# Patient Record
Sex: Female | Born: 1945 | Race: Black or African American | Hispanic: No | Marital: Single
Health system: Midwestern US, Community
[De-identification: ages and names within clinical notes are randomized; demographics above are authoritative.]

## PROBLEM LIST (undated history)

## (undated) DIAGNOSIS — I2510 Atherosclerotic heart disease of native coronary artery without angina pectoris: Secondary | ICD-10-CM

## (undated) DIAGNOSIS — J45909 Unspecified asthma, uncomplicated: Secondary | ICD-10-CM

## (undated) DIAGNOSIS — I10 Essential (primary) hypertension: Secondary | ICD-10-CM

## (undated) DIAGNOSIS — C801 Malignant (primary) neoplasm, unspecified: Secondary | ICD-10-CM

## (undated) DIAGNOSIS — R202 Paresthesia of skin: Secondary | ICD-10-CM

## (undated) DIAGNOSIS — I509 Heart failure, unspecified: Secondary | ICD-10-CM

## (undated) DIAGNOSIS — E119 Type 2 diabetes mellitus without complications: Secondary | ICD-10-CM

## (undated) DIAGNOSIS — I519 Heart disease, unspecified: Secondary | ICD-10-CM

## (undated) DIAGNOSIS — E079 Disorder of thyroid, unspecified: Secondary | ICD-10-CM

## (undated) DIAGNOSIS — R011 Cardiac murmur, unspecified: Secondary | ICD-10-CM

## (undated) DIAGNOSIS — N200 Calculus of kidney: Secondary | ICD-10-CM

## (undated) DIAGNOSIS — E785 Hyperlipidemia, unspecified: Secondary | ICD-10-CM

## (undated) DIAGNOSIS — H4311 Vitreous hemorrhage, right eye: Principal | ICD-10-CM

## (undated) HISTORY — PX: ECTOPIC PREGNANCY SURGERY: SHX613

## (undated) HISTORY — PX: BREAST SURGERY: SHX581

## (undated) HISTORY — PX: KNEE SURGERY: SHX244

## (undated) HISTORY — DX: Hyperlipidemia, unspecified: E78.5

## (undated) HISTORY — DX: Calculus of kidney: N20.0

## (undated) HISTORY — DX: Atherosclerotic heart disease of native coronary artery without angina pectoris: I25.10

## (undated) HISTORY — DX: Unspecified asthma, uncomplicated: J45.909

## (undated) HISTORY — DX: Cardiac murmur, unspecified: R01.1

## (undated) HISTORY — DX: Heart disease, unspecified: I51.9

## (undated) HISTORY — PX: THYROID SURGERY: SHX805

## (undated) HISTORY — DX: Paresthesia of skin: R20.2

## (undated) HISTORY — PX: OTHER SURGICAL HISTORY: SHX169

---

## 2008-03-29 LAB — HM COLONOSCOPY

## 2008-06-13 LAB — HM MAMMOGRAPHY: HM Mammogram: NORMAL

## 2010-03-12 LAB — HM COLONOSCOPY: HM COLON: NORMAL

## 2011-07-24 DIAGNOSIS — M259 Joint disorder, unspecified: Secondary | ICD-10-CM | POA: Diagnosis not present

## 2011-07-24 DIAGNOSIS — M1710 Unilateral primary osteoarthritis, unspecified knee: Secondary | ICD-10-CM | POA: Diagnosis not present

## 2011-07-24 DIAGNOSIS — M25569 Pain in unspecified knee: Secondary | ICD-10-CM | POA: Diagnosis not present

## 2011-09-14 DIAGNOSIS — I10 Essential (primary) hypertension: Secondary | ICD-10-CM | POA: Diagnosis not present

## 2011-09-14 DIAGNOSIS — M238X9 Other internal derangements of unspecified knee: Secondary | ICD-10-CM | POA: Diagnosis not present

## 2011-09-14 DIAGNOSIS — Z96659 Presence of unspecified artificial knee joint: Secondary | ICD-10-CM | POA: Diagnosis not present

## 2011-09-14 DIAGNOSIS — I2510 Atherosclerotic heart disease of native coronary artery without angina pectoris: Secondary | ICD-10-CM | POA: Diagnosis not present

## 2011-09-14 DIAGNOSIS — E1065 Type 1 diabetes mellitus with hyperglycemia: Secondary | ICD-10-CM | POA: Diagnosis not present

## 2011-09-14 DIAGNOSIS — E038 Other specified hypothyroidism: Secondary | ICD-10-CM | POA: Diagnosis not present

## 2013-06-14 LAB — TSH: TSH: 0.61 u[IU]/mL (ref <=5.90)

## 2013-06-14 LAB — BASIC METABOLIC PANEL WITH GFR
BUN: 10 mg/dL (ref 4–21)
Creatinine: 0.8 mg/dL (ref 0.5–1.1)
Glucose: 116 mg/dL

## 2013-12-05 ENCOUNTER — Ambulatory Visit (INDEPENDENT_AMBULATORY_CARE_PROVIDER_SITE_OTHER): Payer: MEDICARE | Admitting: Cardiology

## 2013-12-05 ENCOUNTER — Encounter: Payer: Self-pay | Admitting: Cardiology

## 2013-12-05 VITALS — BP 136/78 | HR 69 | Ht 61.0 in | Wt 184.0 lb

## 2013-12-05 DIAGNOSIS — K219 Gastro-esophageal reflux disease without esophagitis: Secondary | ICD-10-CM

## 2013-12-05 DIAGNOSIS — E038 Other specified hypothyroidism: Secondary | ICD-10-CM

## 2013-12-05 DIAGNOSIS — E039 Hypothyroidism, unspecified: Secondary | ICD-10-CM | POA: Insufficient documentation

## 2013-12-05 DIAGNOSIS — I259 Chronic ischemic heart disease, unspecified: Secondary | ICD-10-CM

## 2013-12-05 DIAGNOSIS — R739 Hyperglycemia, unspecified: Secondary | ICD-10-CM

## 2013-12-05 DIAGNOSIS — I10 Essential (primary) hypertension: Secondary | ICD-10-CM

## 2013-12-05 DIAGNOSIS — R011 Cardiac murmur, unspecified: Secondary | ICD-10-CM | POA: Insufficient documentation

## 2013-12-05 DIAGNOSIS — I5022 Chronic systolic (congestive) heart failure: Secondary | ICD-10-CM

## 2013-12-05 DIAGNOSIS — Z853 Personal history of malignant neoplasm of breast: Secondary | ICD-10-CM

## 2013-12-05 MED ORDER — METOPROLOL TARTRATE 50 MG PO TABS: ORAL_TABLET | ORAL | Status: DC

## 2013-12-05 MED ORDER — LEVOTHYROXINE SODIUM 50 MCG PO CAPS: ORAL_CAPSULE | ORAL | Status: DC

## 2013-12-05 MED ORDER — POTASSIUM CHLORIDE CRYS ER 10 MEQ PO TBCR: 10.0000 meq | EXTENDED_RELEASE_TABLET | Freq: Every day | ORAL | Status: DC

## 2013-12-05 MED ORDER — FUROSEMIDE 40 MG PO TABS
40.0000 mg | ORAL_TABLET | Freq: Every day | ORAL | Status: DC
Start: 2013-12-05 — End: 2014-05-29

## 2013-12-05 MED ORDER — LEVOTHYROXINE SODIUM 75 MCG PO TABS: ORAL_TABLET | ORAL | Status: DC

## 2013-12-05 NOTE — Patient Instructions (Addendum)
Will obtain labs today and call you with the results (LP/BMET/HFP/CBC/A1C//TSH/FT4)  Your physician recommends that you continue on your current medications as directed. Please refer to the Current Medication list given to you today.  Your physician recommends that you schedule a follow-up appointment in: [place] has requested that you have an echocardiogram. Echocardiography is a painless test that uses sound waves to create images of your heart. It provides your doctor with information about the size and shape of your heart and how well your heart's chambers and valves are working. This procedure takes approximately one hour. There are no restrictions for this procedure.  Your physician has requested that you have a lexiscan myoview. For further information please visit [email or web address]. Please follow instruction sheet, as given.  ARRANGE FOR YOU TO SEE DR Garret Reddish WITH [facility]   A chest x-ray takes a picture of the organs and structures inside the chest, including the heart, lungs, and blood vessels. This test can show several things, including, whether the heart is enlarges; whether fluid is building up in the lungs; and whether pacemaker / defibrillator leads are still in place. [place]

## 2013-12-05 NOTE — Progress Notes (Signed)
Alicia [name] Alert Date of Birth:  Aug 01, 1945 [facility] [address] [place], [place]  [address] [phone]        Fax   [phone]   History of Present Illness: This pleasant 68 year old [place] American woman is seen by me for the first time today.  She is self-referred.  She moved here about 3 months ago from [facility].  She is living with her daughter.  She comes to be established with cardiology.  She gives a history of having had congestive heart failure in 1990.  In 1993 she had a heart attack treated with a balloon and the stent.  We don't have any of those details. She has a history of borderline diabetes.  She has a history of high cholesterol and a history of elevated blood pressure.  She has never had a stroke. She has a history of previous breast cancer of the right breast and had a partial mastectomy followed by chemotherapy and radiation. She has a history of hypothyroidism and takes 50 mcg of Synthroid one daily and 75 mcg the next. She has been experiencing occasional chest discomfort.  Last week she took a nitroglycerin for chest discomfort with left arm aching. Her family history reveals that her father had diabetes and and had a history of heart trouble and died at age 38.  Her mother died of a massive heart attack at age 74. Current Outpatient Prescriptions  Medication Sig Dispense Refill  . anastrozole (ARIMIDEX) 1 MG tablet Take 1 mg by mouth daily.      . furosemide (LASIX) 40 MG tablet Take 40 mg by mouth.      . levothyroxine (SYNTHROID, LEVOTHROID) 75 MCG tablet Take 75 mcg by mouth daily before breakfast.      . Levothyroxine Sodium 50 MCG CAPS Take by mouth daily before breakfast.      . metoprolol (LOPRESSOR) 50 MG tablet Take 50 mg by mouth daily. Take 1/2 tablet in the morning and whole tablet in the evening.      . nitroGLYCERIN (NITROSTAT) 0.4 MG SL tablet Place 0.4 mg under the tongue every 5 (five) minutes as needed  for chest pain.      . potassium chloride (K-DUR,KLOR-CON) 10 MEQ tablet Take 10 mEq by mouth once.      . ranitidine (ZANTAC) 150 MG/10ML syrup Take 150 mg by mouth 2 (two) times daily.      . risperiDONE (RISPERDAL) 1 MG tablet Take 1 mg by mouth at bedtime.       No current facility-administered medications for this visit.    Allergies no known allergies  Patient Active Problem List   Diagnosis Date Noted  . Ischemic heart disease 12/05/2013  . Essential hypertension 12/05/2013  . Hypothyroidism 12/05/2013  . History of breast cancer in female 12/05/2013  . GERD (gastroesophageal reflux disease) 12/05/2013  . Chronic systolic congestive heart failure, NYHA class 4 12/05/2013  . Heart murmur 12/05/2013  . Hyperglycemia 12/05/2013    History  Smoking status  . Never Smoker   Smokeless tobacco  . Not on file    History  Alcohol Use No    No family history on file.  Review of Systems: Constitutional: no fever chills diaphoresis or fatigue or change in weight.  Head and neck: no hearing loss, no epistaxis, no photophobia or visual disturbance. Respiratory: No cough, shortness of breath or wheezing. Cardiovascular: No  peripheral edema, palpitations.  Positive for chest pain and left  arm discomfort Gastrointestinal: No abdominal distention, no abdominal pain, no change in bowel habits hematochezia or melena. Genitourinary: No dysuria, no frequency, no urgency, no nocturia. Musculoskeletal:No arthralgias, no back pain, no gait disturbance or myalgias. Neurological: No dizziness, no headaches, no numbness, no seizures, no syncope, no weakness, no tremors. Hematologic: No lymphadenopathy, no easy bruising. Psychiatric: No confusion, no hallucinations, no sleep disturbance.    Physical Exam: Filed Vitals:   12/05/13 1510  BP: 136/78  Pulse: 69  The patient appears to be in no distress.  Head and neck exam reveals that the pupils are equal and reactive.  The  extraocular movements are full.  There is no scleral icterus.  Mouth and pharynx are benign.  No lymphadenopathy.  No carotid bruits.  The jugular venous pressure is normal.  Thyroid is not enlarged or tender.  Chest is clear to percussion and auscultation.  No rales or rhonchi.  Expansion of the chest is symmetrical.  Heart reveals no abnormal lift or heave.  First and second heart sounds are normal.  There is no murmur gallop rub or click.  The abdomen is soft and nontender.  Bowel sounds are normoactive.  There is no hepatosplenomegaly or mass.  There are no abdominal bruits.  Extremities reveal no phlebitis or edema.  Pedal pulses are good.  There is no cyanosis or clubbing.  Neurologic exam is normal strength and no lateralizing weakness.  No sensory deficits.  Integument reveals no rash  EKG today shows normal sinus rhythm and poor R-wave progression anterior precordium and no ischemic changes at rest.  No old EKG for comparison  Assessment / Plan: 1.  History of ischemic heart disease status post acute 1993 treated in [place] with balloon and stent 2. history of congestive heart failure onset 1990.  Details not available 3. history of hyperglycemia and borderline diabetes 4. Hypercholesterolemia 5. Hypothyroidism 6. history of right breast cancer treated with partial mastectomy, chemotherapy and radiation.  Details not known.  Currently on Arimidex.  Disposition: We are checking a chest x-ray and getting baseline lab work on her today including CBC, A1c, lipid panel, hepatic function panel, basal metabolic panel, TSH, and free T4. She will return for a Lexa scan Myoview stress test.  She will also return for an echocardiogram. She will be obtaining a PCP.  We referred her to Dr. Garret Reddish.  Recheck here in 2 months for followup cardiology visit.

## 2013-12-06 LAB — HEPATIC FUNCTION PANEL
ALBUMIN: 3.7 g/dL (ref 3.5–5.2)
ALT: 19 U/L (ref 0–35)
AST: 23 U/L (ref 0–37)
Alkaline Phosphatase: 116 U/L (ref 39–117)
Bilirubin, Direct: 0.1 mg/dL (ref 0.0–0.3)
Total Bilirubin: 0.5 mg/dL (ref 0.2–1.2)
Total Protein: 8.2 g/dL (ref 6.0–8.3)

## 2013-12-06 LAB — BASIC METABOLIC PANEL
BUN: 9 mg/dL (ref 6–23)
CHLORIDE: 104 meq/L (ref 96–112)
CO2: 27 mEq/L (ref 19–32)
CREATININE: 1.1 mg/dL (ref 0.4–1.2)
Calcium: 9.2 mg/dL (ref 8.4–10.5)
GFR: 66.81 mL/min (ref >60.00)
Glucose, Bld: 96 mg/dL (ref 70–99)
POTASSIUM: 3.4 meq/L — AB (ref 3.5–5.1)
Sodium: 139 mEq/L (ref 135–145)

## 2013-12-06 LAB — CBC WITH DIFFERENTIAL/PLATELET
Basophils Absolute: 0 10*3/uL (ref 0.0–0.1)
Basophils Relative: 0.1 % (ref 0.0–3.0)
Eosinophils Absolute: 0.1 10*3/uL (ref 0.0–0.7)
Eosinophils Relative: 1.6 % (ref 0.0–5.0)
HEMATOCRIT: 43.2 % (ref 36.0–46.0)
Hemoglobin: 14.2 g/dL (ref 12.0–15.0)
Lymphocytes Relative: 29.3 % (ref 12.0–46.0)
Lymphs Abs: 2.3 10*3/uL (ref 0.7–4.0)
MCHC: 33 g/dL (ref 30.0–36.0)
MCV: 83.9 fl (ref 78.0–100.0)
MONO ABS: 0.3 10*3/uL (ref 0.1–1.0)
MONOS PCT: 4.2 % (ref 3.0–12.0)
NEUTROS PCT: 64.8 % (ref 43.0–77.0)
Neutro Abs: 5.1 10*3/uL (ref 1.4–7.7)
PLATELETS: 283 10*3/uL (ref 150.0–400.0)
RBC: 5.15 Mil/uL — AB (ref 3.87–5.11)
RDW: 15.2 % (ref 11.5–15.5)
WBC: 7.9 10*3/uL (ref 4.0–10.5)

## 2013-12-06 LAB — LIPID PANEL
CHOL/HDL RATIO: 5
CHOLESTEROL: 191 mg/dL (ref 0–200)
HDL: 37.8 mg/dL — AB (ref >39.00)
NonHDL: 153.2
TRIGLYCERIDES: 225 mg/dL — AB (ref 0.0–149.0)
VLDL: 45 mg/dL — AB (ref 0.0–40.0)

## 2013-12-06 LAB — HEMOGLOBIN A1C: HEMOGLOBIN A1C: 5.8 % (ref 4.6–6.5)

## 2013-12-06 LAB — LDL CHOLESTEROL, DIRECT: Direct LDL: 114.4 mg/dL

## 2013-12-06 LAB — T4, FREE: FREE T4: 0.92 ng/dL (ref 0.60–1.60)

## 2013-12-06 LAB — TSH: TSH: 8.95 u[IU]/mL — AB (ref 0.35–4.50)

## 2013-12-07 NOTE — Progress Notes (Signed)
Quick Note:  Please report to patient. The recent labs are stable. Continue same medication and careful diet. The potassium is low so I want her to increase potassium to two tablets daily. Her LDL cholesterol is too high so I want her to take a statin ---atorvastatin 20 mg daily. Recheck Lipid panel, BMET and HFP when she returns for 2 month OV. Her thyroid is satisfactory at current regimen. A1C is okay. ______

## 2013-12-09 ENCOUNTER — Telehealth: Payer: Self-pay | Admitting: *Deleted

## 2013-12-09 DIAGNOSIS — E7800 Pure hypercholesterolemia, unspecified: Secondary | ICD-10-CM

## 2013-12-09 MED ORDER — ATORVASTATIN CALCIUM 20 MG PO TABS: 20.0000 mg | ORAL_TABLET | Freq: Every day | ORAL | Status: DC

## 2013-12-09 NOTE — Telephone Encounter (Signed)
Advised daughter of labs and sent Rx to pharmacy

## 2013-12-09 NOTE — Telephone Encounter (Signed)
Message copied by Earvin Hansen on Fri Dec 09, 2013  1:50 PM ------      Message from: Darlin Coco      Created: Wed Dec 07, 2013 10:31 AM       Please report to patient.  The recent labs are stable. Continue same medication and careful diet. The potassium is low so I want her to increase potassium to two tablets daily. Her LDL cholesterol is too high so I want her to take a statin ---atorvastatin 20 mg daily. Recheck Lipid panel, BMET and HFP when she returns for 2 month OV.      Her thyroid is satisfactory at current regimen. A1C is okay. ------

## 2013-12-13 ENCOUNTER — Other Ambulatory Visit (HOSPITAL_COMMUNITY): Payer: MEDICARE

## 2013-12-13 ENCOUNTER — Encounter (HOSPITAL_COMMUNITY): Payer: MEDICARE

## 2013-12-16 ENCOUNTER — Ambulatory Visit (HOSPITAL_COMMUNITY): Payer: MEDICARE | Attending: Cardiology | Admitting: Radiology

## 2013-12-16 DIAGNOSIS — I10 Essential (primary) hypertension: Secondary | ICD-10-CM | POA: Diagnosis not present

## 2013-12-16 DIAGNOSIS — R011 Cardiac murmur, unspecified: Secondary | ICD-10-CM | POA: Insufficient documentation

## 2013-12-16 NOTE — Progress Notes (Signed)
Echocardiogram performed.  

## 2014-01-03 ENCOUNTER — Telehealth: Payer: Self-pay | Admitting: *Deleted

## 2014-01-03 NOTE — Telephone Encounter (Signed)
noted 

## 2014-01-03 NOTE — Telephone Encounter (Signed)
-----   Message from Philbert Riser sent at 12/13/2013 10:23 AM EST ----- Regarding: FW: Ok to R/S [place], This  patient was late for her echo this morning so I reschedule it for 12/16/13 and she also said that she did not want to have her Myoview it made her sick the last time.See below the Insurance approved it,Charmain is aware of what the patient said to me this morning. ----- Message -----    From: Belt: 12/13/2013   9:58 AM      To: Philbert Riser Subject: Ok to R/S                                      Deb  So of course when i sent info STAT to ins co they approved it overnight.  Ok to r/s [employer].  Thanks for your help!

## 2014-02-15 ENCOUNTER — Ambulatory Visit: Payer: MEDICARE | Admitting: Cardiology

## 2014-02-20 ENCOUNTER — Emergency Department (HOSPITAL_COMMUNITY)
Admission: EM | Admit: 2014-02-20 | Discharge: 2014-02-20 | Disposition: A | Discharge status: Home / Self Care | Payer: Medicare Other | Attending: Emergency Medicine | Admitting: Emergency Medicine

## 2014-02-20 ENCOUNTER — Encounter (HOSPITAL_COMMUNITY): Payer: Self-pay | Admitting: Emergency Medicine

## 2014-02-20 ENCOUNTER — Emergency Department (HOSPITAL_COMMUNITY): Payer: Medicare Other

## 2014-02-20 DIAGNOSIS — R079 Chest pain, unspecified: Secondary | ICD-10-CM

## 2014-02-20 DIAGNOSIS — E079 Disorder of thyroid, unspecified: Secondary | ICD-10-CM | POA: Insufficient documentation

## 2014-02-20 DIAGNOSIS — R0602 Shortness of breath: Secondary | ICD-10-CM | POA: Insufficient documentation

## 2014-02-20 DIAGNOSIS — Z8589 Personal history of malignant neoplasm of other organs and systems: Secondary | ICD-10-CM | POA: Diagnosis not present

## 2014-02-20 DIAGNOSIS — I509 Heart failure, unspecified: Secondary | ICD-10-CM | POA: Insufficient documentation

## 2014-02-20 DIAGNOSIS — Z79899 Other long term (current) drug therapy: Secondary | ICD-10-CM | POA: Insufficient documentation

## 2014-02-20 DIAGNOSIS — I10 Essential (primary) hypertension: Secondary | ICD-10-CM | POA: Insufficient documentation

## 2014-02-20 DIAGNOSIS — E119 Type 2 diabetes mellitus without complications: Secondary | ICD-10-CM | POA: Insufficient documentation

## 2014-02-20 HISTORY — DX: Disorder of thyroid, unspecified: E07.9

## 2014-02-20 HISTORY — DX: Essential (primary) hypertension: I10

## 2014-02-20 HISTORY — DX: Malignant (primary) neoplasm, unspecified: C80.1

## 2014-02-20 HISTORY — DX: Type 2 diabetes mellitus without complications: E11.9

## 2014-02-20 HISTORY — DX: Heart failure, unspecified: I50.9

## 2014-02-20 LAB — CBC
HEMATOCRIT: 43.7 % (ref 36.0–46.0)
Hemoglobin: 14.9 g/dL (ref 12.0–15.0)
MCH: 27.6 pg (ref 26.0–34.0)
MCHC: 34.1 g/dL (ref 30.0–36.0)
MCV: 81.1 fL (ref 78.0–100.0)
Platelets: 249 10*3/uL (ref 150–400)
RBC: 5.39 MIL/uL — ABNORMAL HIGH (ref 3.87–5.11)
RDW: 14.6 % (ref 11.5–15.5)
WBC: 7.3 10*3/uL (ref 4.0–10.5)

## 2014-02-20 LAB — BASIC METABOLIC PANEL
Anion gap: 9 (ref 5–15)
BUN: 11 mg/dL (ref 6–23)
CO2: 29 mmol/L (ref 19–32)
CREATININE: 0.99 mg/dL (ref 0.50–1.10)
Calcium: 9.4 mg/dL (ref 8.4–10.5)
Chloride: 103 mEq/L (ref 96–112)
GFR, EST AFRICAN AMERICAN: 66 mL/min — AB (ref >90)
GFR, EST NON AFRICAN AMERICAN: 57 mL/min — AB (ref >90)
GLUCOSE: 131 mg/dL — AB (ref 70–99)
POTASSIUM: 3.4 mmol/L — AB (ref 3.5–5.1)
SODIUM: 141 mmol/L (ref 135–145)

## 2014-02-20 LAB — D-DIMER, QUANTITATIVE (NOT AT ARMC): D-Dimer, Quant: 0.29 ug/mL-FEU (ref 0.00–0.48)

## 2014-02-20 LAB — I-STAT TROPONIN, ED: Troponin i, poc: 0 ng/mL (ref 0.00–0.08)

## 2014-02-20 LAB — BRAIN NATRIURETIC PEPTIDE: B NATRIURETIC PEPTIDE 5: 34.3 pg/mL (ref 0.0–100.0)

## 2014-02-20 MED ORDER — MORPHINE SULFATE 4 MG/ML IJ SOLN
4.0000 mg | Freq: Once | INTRAMUSCULAR | Status: AC
  Administered 2014-02-20: 4 mg via INTRAVENOUS
  Filled 2014-02-20: qty 1

## 2014-02-20 NOTE — ED Notes (Signed)
AVS explained in detail. Knows to follow up with cardiology. RR even/unlabored.

## 2014-02-20 NOTE — Discharge Instructions (Signed)
Call for a follow up appointment with a Family or Primary Care Provider.  Call your cardiologist for further evaluation of your chest pain. Return if Symptoms worsen.   Take medication as prescribed.

## 2014-02-20 NOTE — ED Notes (Signed)
Pt states that started haivng central chest pain last night with SHOB.  Pt states that she has had these similar symptoms before and her lungs were collapsed, so pt is requesting a chest xray.  Pt has CHF.

## 2014-02-20 NOTE — ED Provider Notes (Signed)
  Face-to-face evaluation   History: Patient presents for evaluation of shortness of breath with a feeling like her lung is collapsed.  She also reports a 10 pound weight gain, recently.  She denies chest pain, weakness or dizziness.  Physical exam: Alert, calm, cooperative.  She is not in respiratory distress.  Neuro exam is grossly nonfocal   Medical screening examination/treatment/procedure(s) were conducted as a shared visit with non-physician practitioner(s) and myself.  I personally evaluated the patient during the encounter  Richarda Blade, MD 02/24/14 [phone]

## 2014-02-20 NOTE — ED Provider Notes (Signed)
CSN: [id number]     Arrival date & time 02/20/14  61 History   First MD Initiated Contact with Patient 02/20/14 1109     Chief Complaint  Patient presents with  . Chest Pain  . Shortness of Breath     (Consider location/radiation/quality/duration/timing/severity/associated sxs/prior Treatment) HPI Comments: The patient is a 69 year old female past medical history of CAD status post stenting, pneumothorax status post lung tacking, breast cancer s/p lumpectomy, chest discomfort since last night. She reports central chest discomfort, nonradiating, described as dull, cramping worsened with exertion. Denies pleuras, Patient reports associated dyspnea. Denies lower extremity edema, palpitations, cough. Reports symptoms similar to previous pneumothorax. No treatment prior to arrival. Patient reports last stress test greater than 5 years ago.  PCP: unsure, scheduled first appointment to establish care.  Recently transferred from [place] Cardiologist: [place]   Patient is a 69 y.o. female presenting with chest pain and shortness of breath. The history is provided by the patient. No language interpreter was used.  Chest Pain Associated symptoms: shortness of breath   Associated symptoms: no abdominal pain, no cough, no fever and no palpitations   Shortness of Breath Associated symptoms: chest pain   Associated symptoms: no abdominal pain, no cough and no fever     Past Medical History  Diagnosis Date  . CHF (congestive heart failure)   . Thyroid disease   . Hypertension   . Diabetes mellitus without complication   . Cancer    Past Surgical History  Procedure Laterality Date  . Knee surgery    . Thyroid surgery    . Lung stapled to ribs    . Breast surgery    . Lymphnodes Right     axillary   No family history on file. History  Substance Use Topics  . Smoking status: Never Smoker   . Smokeless tobacco: Not on file  . Alcohol Use: No   OB History    No data available      Review of Systems  Constitutional: Negative for fever and chills.  Respiratory: Positive for shortness of breath. Negative for cough.   Cardiovascular: Positive for chest pain. Negative for palpitations and leg swelling.  Gastrointestinal: Negative for abdominal pain.      Allergies  Review of patient's allergies indicates no known allergies.  Home Medications   Prior to Admission medications   Medication Sig Start Date End Date Taking? Authorizing Provider  anastrozole (ARIMIDEX) 1 MG tablet Take 1 mg by mouth daily.    Historical Provider, MD  atorvastatin (LIPITOR) 20 MG tablet Take 1 tablet (20 mg total) by mouth daily. 12/09/13   Darlin Coco, MD  furosemide (LASIX) 40 MG tablet Take 1 tablet (40 mg total) by mouth daily. 12/05/13   Darlin Coco, MD  levothyroxine (SYNTHROID, LEVOTHROID) 75 MCG tablet Every other day alternating with 50 mcg 12/05/13   Darlin Coco, MD  Levothyroxine Sodium 50 MCG CAPS Every other day alternating with 75 mcg 12/05/13   Darlin Coco, MD  metoprolol (LOPRESSOR) 50 MG tablet Take 1/2 tablet in the morning and whole tablet in the evening. 12/05/13   Darlin Coco, MD  nitroGLYCERIN (NITROSTAT) 0.4 MG SL tablet Place 0.4 mg under the tongue every 5 (five) minutes as needed for chest pain.    Historical Provider, MD  potassium chloride (K-DUR,KLOR-CON) 10 MEQ tablet Take 1 tablet (10 mEq total) by mouth daily. 12/05/13   Darlin Coco, MD  ranitidine (ZANTAC) 150 MG/10ML syrup Take 150 mg by mouth 2 (  two) times daily.    Historical Provider, MD  risperiDONE (RISPERDAL) 1 MG tablet Take 1 mg by mouth at bedtime.    Historical Provider, MD   BP 156/75 mmHg  Pulse 65  Temp(Src) 98.5 F (36.9 C) (Oral)  Resp 20  SpO2 96% Physical Exam  Constitutional: She is oriented to person, place, and time. She appears well-developed and well-nourished. No distress.  HENT:  Head: Normocephalic and atraumatic.  Neck: Neck supple.   Cardiovascular: Normal rate and regular rhythm.   No lower extremity edema. No calf tenderness.  Pulmonary/Chest: Effort normal. No respiratory distress. She has no wheezes. She has no rales. She exhibits no tenderness.  Abdominal: Soft. There is no tenderness. There is no rebound and no guarding.  Neurological: She is alert and oriented to person, place, and time.  Skin: Skin is warm and dry. She is not diaphoretic.  Psychiatric: She has a normal mood and affect. Her behavior is normal.  Nursing note and vitals reviewed.   ED Course  Procedures (including critical care time) Labs Review Labs Reviewed  CBC - Abnormal; Notable for the following:    RBC 5.39 (*)    All other components within normal limits  BASIC METABOLIC PANEL - Abnormal; Notable for the following:    Potassium 3.4 (*)    Glucose, Bld 131 (*)    GFR calc non Af Amer 57 (*)    GFR calc Af Amer 66 (*)    All other components within normal limits  BRAIN NATRIURETIC PEPTIDE  D-DIMER, QUANTITATIVE  Randolm Idol, ED    Imaging Review Dg Chest 2 View  02/20/2014   CLINICAL DATA:  Nonsmoker. New onset mid chest pain. Discomfort shortness of breath. History of CHF.  EXAM: CHEST  2 VIEW  COMPARISON:  None.  FINDINGS: Heart size is normal. There is minimal pericardial or mediastinal calcification. No focal consolidations or pleural effusions. No pulmonary edema. Visualized osseous structures have a normal appearance.  IMPRESSION: 1.  No evidence for acute cardiopulmonary abnormality. 2. Suspect pericardial calcifications.   Electronically Signed   By: Shon Hale M.D.   On: 02/20/2014 11:43     EKG Interpretation   Date/Time:  Monday February 20 2014 10:46:39 EST Ventricular Rate:  66 PR Interval:  166 QRS Duration: 73 QT Interval:  443 QTC Calculation: 464 R Axis:   -4 Text Interpretation:  Sinus rhythm Probable left atrial enlargement Low  voltage, precordial leads Baseline wander in lead(s) II V2 No old  tracing  to compare Confirmed by [facility]  MD, ELLIOTT [phone]) on 02/20/2014 12:35:08  PM      MDM   Final diagnoses:  Chest pain, unspecified chest pain type   Patient with known CAD presents with atypical chest discomfort and sensation of dyspnea for greater than 12 hours. SPO2 100% on room air with talking. 12/2013 EF 50-55%. X-ray without concerning abnormalities EKG with likely left atrial enlargement no previous tracing to compare.  Able to ambulate without be coming hypoxic. Initial troponin negative. Patient with a positive history of breast cancer, d-dimer ordered. Re-eval pt resting comfortably in room, reports resolution of symptoms after medication. D-dimer negative, workup without concerning abnormalities. Discussed patient history, condition with Dr. Eulis Foster who also evaluated the patient. Plan to discharge with cardiology follow-up. Discussed lab results, imaging results, and treatment plan with the patient. Return precautions given. Reports understanding and no other concerns at this time.  Patient is stable for discharge at this time.  Harvie Heck, PA-C 02/21/14 0740  Richarda Blade, MD 02/24/14 [phone]

## 2014-03-14 ENCOUNTER — Ambulatory Visit: Payer: Self-pay | Admitting: Cardiology

## 2014-03-22 ENCOUNTER — Encounter: Payer: Self-pay | Admitting: Cardiology

## 2014-05-29 ENCOUNTER — Encounter: Payer: Self-pay | Admitting: Family Medicine

## 2014-05-29 ENCOUNTER — Ambulatory Visit (INDEPENDENT_AMBULATORY_CARE_PROVIDER_SITE_OTHER): Payer: Medicare Other | Admitting: Family Medicine

## 2014-05-29 VITALS — BP 112/80 | HR 81 | Temp 98.9°F | Wt 189.0 lb

## 2014-05-29 DIAGNOSIS — E7800 Pure hypercholesterolemia, unspecified: Secondary | ICD-10-CM

## 2014-05-29 DIAGNOSIS — I5022 Chronic systolic (congestive) heart failure: Secondary | ICD-10-CM

## 2014-05-29 DIAGNOSIS — Z853 Personal history of malignant neoplasm of breast: Secondary | ICD-10-CM

## 2014-05-29 DIAGNOSIS — I259 Chronic ischemic heart disease, unspecified: Secondary | ICD-10-CM

## 2014-05-29 DIAGNOSIS — I10 Essential (primary) hypertension: Secondary | ICD-10-CM

## 2014-05-29 DIAGNOSIS — E038 Other specified hypothyroidism: Secondary | ICD-10-CM | POA: Diagnosis not present

## 2014-05-29 DIAGNOSIS — F209 Schizophrenia, unspecified: Secondary | ICD-10-CM | POA: Diagnosis not present

## 2014-05-29 DIAGNOSIS — E785 Hyperlipidemia, unspecified: Secondary | ICD-10-CM | POA: Diagnosis not present

## 2014-05-29 MED ORDER — LEVOTHYROXINE SODIUM 50 MCG PO CAPS: ORAL_CAPSULE | ORAL | Status: DC

## 2014-05-29 MED ORDER — LEVOTHYROXINE SODIUM 75 MCG PO TABS: 75.0000 ug | ORAL_TABLET | ORAL | Status: DC

## 2014-05-29 MED ORDER — POTASSIUM CHLORIDE CRYS ER 10 MEQ PO TBCR: 10.0000 meq | EXTENDED_RELEASE_TABLET | Freq: Every day | ORAL | Status: DC

## 2014-05-29 MED ORDER — METOPROLOL TARTRATE 50 MG PO TABS: ORAL_TABLET | ORAL | Status: DC

## 2014-05-29 MED ORDER — ATORVASTATIN CALCIUM 20 MG PO TABS: 20.0000 mg | ORAL_TABLET | Freq: Every day | ORAL | Status: DC

## 2014-05-29 MED ORDER — FUROSEMIDE 40 MG PO TABS: 40.0000 mg | ORAL_TABLET | Freq: Every day | ORAL | Status: DC

## 2014-05-29 MED ORDER — RISPERIDONE 1 MG PO TABS: 1.0000 mg | ORAL_TABLET | Freq: Every day | ORAL | Status: DC

## 2014-05-29 MED ORDER — ANASTROZOLE 1 MG PO TABS: 1.0000 mg | ORAL_TABLET | Freq: Every evening | ORAL | Status: DC

## 2014-05-29 MED ORDER — RANITIDINE HCL 150 MG PO TABS: 150.0000 mg | ORAL_TABLET | Freq: Two times a day (BID) | ORAL | Status: DC

## 2014-05-29 NOTE — Assessment & Plan Note (Addendum)
Refilled arimedex but need records and have referred to oncology as typically i would not prescribe that without oncology weighing in. Mammogram handout given.

## 2014-05-29 NOTE — Assessment & Plan Note (Signed)
S: reported MI 31 with angioplasty. Noncompliant with statin as prescribed by cardiology. Occasional chest pain with difficult to quantify amount that is controlled by nitro with no recent increase.  A/P: Patient was supposed to follow up with cards and I have encouraged her to do so. Also encouraged her to take her statin.

## 2014-05-29 NOTE — Assessment & Plan Note (Addendum)
S: compliant with lasix. Per last echo this appears to be diastolic.  A/P: continue lasix, appears euvolemic. Follow up with cards. Per symptoms today, would seem more class II

## 2014-05-29 NOTE — Progress Notes (Signed)
Alicia Reddish, MD Phone: [phone]  Subjective:  Patient presents today to establish care. Has been a year without PCP, formerly lived in [place] complaint-noted.   See problem oriented charting and problem list overview Insurance wouldn't cover stress test so only had echo Colonoscopy 2014-need records Last pap 2014-no abnormal paps. 5 pregnancies, 3 live births. Does not need further paps.  ROS- pertinent negatives to problem oriented charting include: denies shortness of breath with rest or with walking, diaphoresis, nausea, ROS-No hair or nail changes. No heat/cold intolerance. No constipation or diarrhea. Denies shakiness or anxiety.  The following were reviewed and entered/updated in epic: Past Medical History  Diagnosis Date  . CHF (congestive heart failure)   . Thyroid disease   . Hypertension   . Diabetes mellitus without complication   . Cancer   . Heart disease   . Asthma     childhood  . Heart murmur     echo without obvious valvular issues  . Hyperlipidemia   . Kidney stone    Patient Active Problem List   Diagnosis Date Noted  . Schizophrenia 05/29/2014    Priority: High  . Ischemic heart disease 12/05/2013    Priority: High  . Chronic systolic congestive heart failure, NYHA class 4 12/05/2013    Priority: High  . Hyperlipidemia 05/29/2014    Priority: Medium  . Essential hypertension 12/05/2013    Priority: Medium  . Hypothyroidism 12/05/2013    Priority: Medium  . History of breast cancer in female 12/05/2013    Priority: Medium  . Hyperglycemia 12/05/2013    Priority: Medium  . GERD (gastroesophageal reflux disease) 12/05/2013    Priority: Low  . Heart murmur 12/05/2013   Past Surgical History  Procedure Laterality Date  . Knee surgery      knee replacement  . Thyroid surgery      surgery x2, adioactive iodine afte rreportedly 2006  . Lung stapled to ribs      pneumothorax x 2  . Breast surgery    . Lymphnodes Right    axillary  . Ectopic pregnancy surgery      Family History  Problem Relation Age of Onset  . Hyperlipidemia Mother   . Heart attack Father 39  . Heart attack Mother 63  . Stroke Father 62  . High blood pressure Father     Medications- reviewed and updated Current Outpatient Prescriptions  Medication Sig Dispense Refill  . anastrozole (ARIMIDEX) 1 MG tablet Take 1 tablet (1 mg total) by mouth every evening. 90 tablet 3  . atorvastatin (LIPITOR) 20 MG tablet Take 1 tablet (20 mg total) by mouth daily. 90 tablet 3  . calcium-vitamin D (OSCAL WITH D) 500-200 MG-UNIT per tablet Take 2 tablets by mouth every evening.    . furosemide (LASIX) 40 MG tablet Take 1 tablet (40 mg total) by mouth at bedtime. 30 tablet 3  . levothyroxine (SYNTHROID, LEVOTHROID) 75 MCG tablet Take 1 tablet (75 mcg total) by mouth every other day. Every other day alternating with 50 mcg 90 tablet 3  . Levothyroxine Sodium 50 MCG CAPS Every other day alternating with 75 mcg 30 capsule 3  . metoprolol (LOPRESSOR) 50 MG tablet Take 1/2 tablet in the morning and whole tablet in the evening. 45 tablet 3  . Multiple Vitamin (MULTIVITAMIN WITH MINERALS) TABS tablet Take 1 tablet by mouth daily.    . potassium chloride (K-DUR,KLOR-CON) 10 MEQ tablet Take 1 tablet (10 mEq total) by mouth daily. 90 tablet  3  . ranitidine (ZANTAC) 150 MG tablet Take 1 tablet (150 mg total) by mouth 2 (two) times daily. 90 tablet 3  . risperiDONE (RISPERDAL) 1 MG tablet Take 1 tablet (1 mg total) by mouth at bedtime. 90 tablet 3  . nitroGLYCERIN (NITROSTAT) 0.4 MG SL tablet Place 0.4 mg under the tongue every 5 (five) minutes as needed for chest pain.     Allergies-reviewed and updated No Known Allergies  History   Social History  . Marital Status: Single    Spouse Name: N/A  . Number of Children: N/A  . Years of Education: N/A   Social History Main Topics  . Smoking status: Never Smoker   . Smokeless tobacco: Not on file  . Alcohol  Use: No  . Drug Use: No  . Sexual Activity: Not on file   Other Topics Concern  . 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 Oropharynx normal. TM normal. Eyes: sclera and lids normal, PERRLA Neck: no thyromegaly, no cervical lymphadenopathy CV: RRR no murmurs rubs or gallops Lungs: CTAB no crackles, wheeze, rhonchi Abdomen: soft/nontender/nondistended/normal bowel sounds. No rebound or guarding.  Ext: no edema Skin: warm, dry, no rash  Neuro: 5/5 strength in upper and lower extremities, alert and oriented  Assessment/Plan:  Ischemic heart disease S: reported MI 1993 with angioplasty. Noncompliant with statin as prescribed by cardiology. Occasional chest pain with difficult to quantify amount that is controlled by nitro with no recent increase.  A/P: Patient was supposed to follow up with cards and I have encouraged her to do so. Also encouraged her to take her statin.     Chronic systolic congestive heart failure, NYHA class 4 S: compliant with lasix. Per last echo this appears to be diastolic.  A/P: continue lasix, appears euvolemic. Follow up with cards. Per symptoms today, would seem more class II    Schizophrenia S:Seeing shadows that are not there since running out of risperdal. Reports schizophrenia but no records available.  A/P: get records from prior providers. Discussed with daughter that needed help getting mother in with psychiatry given on antipsychotics.     Hypothyroidism S: last TSH  elevated. Denies symptoms A/P: check today, if TSH elevated, increase to 18mcg daily    History of breast cancer in female Refilled arimedex but need records and have referred to oncology as typically i would not prescribe that without oncology weighing in. Mammogram handout given.     Return precautions advised. 3 month follow up planned  Orders Placed This Encounter  Procedures  . CBC    [place]  . Comprehensive metabolic panel    [place]  . TSH    [place]  . Ambulatory referral to Oncology    Referral Priority:  Routine    Referral Type:  Consultation    Referral Reason:  Specialty Services Required    Number of Visits Requested:  1   Meds ordered this encounter  Medications  .  anastrozole (ARIMIDEX) 1 MG tablet    Sig: Take 1 tablet (1 mg total) by mouth every evening.    Dispense:  90 tablet    Refill:  3  . atorvastatin (LIPITOR) 20 MG tablet    Sig: Take 1 tablet (20 mg total) by mouth daily.    Dispense:  90 tablet    Refill:  3  . furosemide (LASIX) 40 MG tablet    Sig: Take 1 tablet (40 mg total) by mouth at bedtime.    Dispense:  30 tablet    Refill:  3  . levothyroxine (SYNTHROID, LEVOTHROID) 75 MCG tablet    Sig: Take 1 tablet (75 mcg total) by mouth every other day. Every other day alternating with 50 mcg    Dispense:  90 tablet    Refill:  3  . Levothyroxine Sodium 50 MCG CAPS    Sig: Every other day alternating with 75 mcg    Dispense:  30 capsule    Refill:  3  . metoprolol (LOPRESSOR) 50 MG tablet    Sig: Take 1/2 tablet in the morning and whole tablet in the evening.    Dispense:  45 tablet    Refill:  3  . potassium chloride (K-DUR,KLOR-CON) 10 MEQ tablet    Sig: Take 1 tablet (10 mEq total) by mouth daily.    Dispense:  90 tablet    Refill:  3  . ranitidine (ZANTAC) 150 MG tablet    Sig: Take 1 tablet (150 mg total) by mouth 2 (two) times daily.    Dispense:  90 tablet    Refill:  3  . risperiDONE (RISPERDAL) 1 MG tablet    Sig: Take 1  tablet (1 mg total) by mouth at bedtime.    Dispense:  90 tablet    Refill:  3

## 2014-05-29 NOTE — Assessment & Plan Note (Signed)
S: last TSH elevated. Denies symptoms A/P: check today, if TSH elevated, increase to 31mcg daily

## 2014-05-29 NOTE — Patient Instructions (Addendum)
Get mammogram scheduled.  We will call you within a week about your referral to oncology. If you do not hear within 2 weeks, give [place] a call.   Need you to check in with your insurance and get set up with psychiatry   Refilled all meds.   Check labs today  Sign release of information at the front desk for any discharge summaries, any colonoscopy, any immunizations, pap smears for last 10 years, office notes for last 3 years.   See each other in 3 months unless labs indicate we need to see each other sooner.   Happy to have your daughter as a patient if desired. Willing to work with patient on dates.

## 2014-05-29 NOTE — Assessment & Plan Note (Signed)
S:Seeing shadows that are not there since running out of risperdal. Reports schizophrenia but no records available.  A/P: get records from prior providers. Discussed with daughter that needed help getting mother in with psychiatry given on antipsychotics.

## 2014-05-30 LAB — TSH: TSH: 9.95 u[IU]/mL — ABNORMAL HIGH (ref 0.35–4.50)

## 2014-05-30 LAB — COMPREHENSIVE METABOLIC PANEL
ALBUMIN: 4.4 g/dL (ref 3.5–5.2)
ALK PHOS: 137 U/L — AB (ref 39–117)
ALT: 16 U/L (ref 0–35)
AST: 17 U/L (ref 0–37)
BUN: 8 mg/dL (ref 6–23)
CALCIUM: 9.6 mg/dL (ref 8.4–10.5)
CO2: 33 mEq/L — ABNORMAL HIGH (ref 19–32)
Chloride: 102 mEq/L (ref 96–112)
Creatinine, Ser: 0.96 mg/dL (ref 0.40–1.20)
GFR: 73.99 mL/min (ref >60.00)
Glucose, Bld: 111 mg/dL — ABNORMAL HIGH (ref 70–99)
POTASSIUM: 3.2 meq/L — AB (ref 3.5–5.1)
Sodium: 142 mEq/L (ref 135–145)
Total Bilirubin: 0.6 mg/dL (ref 0.2–1.2)
Total Protein: 8.1 g/dL (ref 6.0–8.3)

## 2014-05-30 LAB — CBC
HCT: 43.5 % (ref 36.0–46.0)
Hemoglobin: 14.5 g/dL (ref 12.0–15.0)
MCHC: 33.4 g/dL (ref 30.0–36.0)
MCV: 82 fl (ref 78.0–100.0)
Platelets: 315 10*3/uL (ref 150.0–400.0)
RBC: 5.3 Mil/uL — ABNORMAL HIGH (ref 3.87–5.11)
RDW: 15.7 % — AB (ref 11.5–15.5)
WBC: 7.7 10*3/uL (ref 4.0–10.5)

## 2014-05-30 MED ORDER — LEVOTHYROXINE SODIUM 75 MCG PO TABS: 75.0000 ug | ORAL_TABLET | Freq: Every day | ORAL | Status: DC

## 2014-05-30 NOTE — Addendum Note (Signed)
Addended by: Clyde Lundborg A on: 05/30/2014 01:40 PM   Modules accepted: Orders

## 2014-06-01 ENCOUNTER — Telehealth: Payer: Self-pay | Admitting: *Deleted

## 2014-06-01 NOTE — Telephone Encounter (Signed)
Received referral from Dr. Arrie Aran office.  Called and left a message for the pt to return my call so I can get her scheduled w/ a med onc.

## 2014-06-05 ENCOUNTER — Telehealth: Payer: Self-pay | Admitting: *Deleted

## 2014-06-05 NOTE — Telephone Encounter (Signed)
Left message for pt to return my call so I can schedule her w/ a med onc.

## 2014-06-08 ENCOUNTER — Telehealth: Payer: Self-pay | Admitting: *Deleted

## 2014-06-08 NOTE — Telephone Encounter (Signed)
Called pt and confirmed 07/03/14 med onc appt w/ her.  Obtained information from last facility seen and faxed a request for records.  Mailed calendar, welcoming packet & intake form to pt.

## 2014-06-15 LAB — BASIC METABOLIC PANEL
Potassium: 4.1 mmol/L (ref 3.4–5.3)
SODIUM: 146 mmol/L (ref 137–147)

## 2014-06-26 ENCOUNTER — Telehealth: Payer: Self-pay | Admitting: *Deleted

## 2014-06-26 NOTE — Telephone Encounter (Signed)
Called Alicia [name] in Medical Records at previous facility and all she has is for other diagnosis.  Called and left a message for the pt to return my call so I can verify diagnosis and past history facility.

## 2014-06-28 ENCOUNTER — Other Ambulatory Visit: Payer: Self-pay

## 2014-06-28 DIAGNOSIS — Z1231 Encounter for screening mammogram for malignant neoplasm of breast: Secondary | ICD-10-CM

## 2014-06-30 ENCOUNTER — Telehealth: Payer: Self-pay | Admitting: *Deleted

## 2014-06-30 NOTE — Telephone Encounter (Signed)
Left message for pt to call back.  Need to know when pt had mammogram, when, and where

## 2014-07-03 ENCOUNTER — Encounter: Payer: Self-pay | Admitting: Hematology

## 2014-07-03 ENCOUNTER — Ambulatory Visit: Payer: Medicare Other

## 2014-07-03 ENCOUNTER — Ambulatory Visit (HOSPITAL_BASED_OUTPATIENT_CLINIC_OR_DEPARTMENT_OTHER): Payer: Medicare Other | Admitting: Hematology

## 2014-07-03 VITALS — BP 133/66 | HR 66 | Temp 98.5°F | Resp 18 | Ht 61.0 in | Wt 185.3 lb

## 2014-07-03 DIAGNOSIS — Z85819 Personal history of malignant neoplasm of unspecified site of lip, oral cavity, and pharynx: Secondary | ICD-10-CM | POA: Insufficient documentation

## 2014-07-03 DIAGNOSIS — Z85818 Personal history of malignant neoplasm of other sites of lip, oral cavity, and pharynx: Secondary | ICD-10-CM | POA: Diagnosis not present

## 2014-07-03 DIAGNOSIS — Z853 Personal history of malignant neoplasm of breast: Secondary | ICD-10-CM | POA: Diagnosis not present

## 2014-07-03 NOTE — Progress Notes (Signed)
[facility]  Telephone:(336) [phone] Fax:([phone]) [place] Note   Patient Care Team: Alicia Olp, MD as PCP - General (Family Medicine) 07/03/2014  CHIEF COMPLAINTS/PURPOSE OF CONSULTATION:  History of breast and throat cancer  HISTORY OF PRESENTING ILLNESS:  Alicia [name] 69 y.o. female is here because of her personal history of breast and throat cancer. She was recently relocated from [facility] to [facility] to stay with her daughter. She was referred to establish her oncological care with [place]. She presents with her daughter.  According to patient, she was diagnosed with right breast cancer in 2003, had neoadjuvant chemo (TCx4), follow by lumpectomy, and radiation afterwards. She then started Anastrozole, and has been on it since February 2016. She denies any history of cancer recurrence. Unfortunately her prior oncology medical records was not available during her visit. She has last mammogram in 2014 in [place], she is schedule to have one on /11/2014.   She also had throat cancer about 10 years ago, she had surgeries twice, and radiation, no chemo. She has been disease-free since then.  She feels well overall, no pain, cough, dyspnea or GI symptoms. No weight loss. She has good appetite and energy level.    She has history of schizophrenia, has been out of medication Risperdal for a few months. Per patient's daughter, she does have some hallucination, but mood is stable.    MEDICAL HISTORY:  Past Medical History  Diagnosis Date  . CHF (congestive heart failure)   . Thyroid disease   . Hypertension   . Diabetes mellitus without complication   . Cancer   . Heart disease   . Asthma     childhood  . Heart murmur     echo without obvious valvular issues  . Hyperlipidemia   . Kidney stone     SURGICAL HISTORY: Past Surgical History  Procedure Laterality Date  . Knee surgery      knee replacement  .  Thyroid surgery      surgery x2, adioactive iodine afte rreportedly 2006  . Lung stapled to ribs      pneumothorax x 2  . Breast surgery    . Lymphnodes Right     axillary  . Ectopic pregnancy surgery      SOCIAL HISTORY: History   Social History  . Marital Status: Single    Spouse Name: N/A  . Number of Children: N/A  . Years of Education: N/A   Occupational History  . Not on file.   Social History Main Topics  . Smoking status: Never Smoker   . Smokeless tobacco: Not on file  . Alcohol Use: No  . Drug Use: No  . Sexual Activity: Not on file   Other Topics Concern  . 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 Hyperlipidemia Mother   . Heart attack Father 57  . Heart attack Mother 67  . Stroke Father 61  . High blood pressure Father     ALLERGIES:  has No Known Allergies.  MEDICATIONS:  Current Outpatient Prescriptions  Medication Sig Dispense Refill  . atorvastatin (LIPITOR) 20 MG tablet Take 1 tablet (20 mg total) by mouth daily. 90 tablet 3  . calcium-vitamin D (OSCAL WITH D) 500-200 MG-UNIT per tablet Take 2 tablets by mouth every evening.    . furosemide (LASIX) 40 MG tablet Take 1 tablet (40 mg total) by mouth at bedtime. 30 tablet 3  . levothyroxine (SYNTHROID, LEVOTHROID) 75 MCG tablet Take 1 tablet (75 mcg total) by mouth every other day. Every other day alternating with 50 mcg 90 tablet 3  . Levothyroxine Sodium 50 MCG CAPS Every other day alternating with 75 mcg 30 capsule 3  . metoprolol (LOPRESSOR) 50 MG tablet Take 1/2 tablet in the morning and whole tablet  in the evening. 45 tablet 3  . Multiple Vitamin (MULTIVITAMIN WITH MINERALS) TABS tablet Take 1 tablet by mouth daily.    . potassium chloride (K-DUR,KLOR-CON) 10 MEQ tablet Take 1 tablet (10 mEq total) by mouth daily. 90 tablet 3  . ranitidine (ZANTAC) 150 MG tablet Take 1 tablet (150 mg total) by mouth 2 (two) times daily. 90 tablet 3  . risperiDONE (RISPERDAL) 1 MG tablet Take 1 tablet (1 mg total) by mouth at bedtime. 90 tablet 3  . anastrozole (ARIMIDEX) 1 MG tablet Take 1 tablet (1 mg total) by mouth every evening. (Patient not taking: Reported on 07/03/2014) 90 tablet 3  . nitroGLYCERIN (NITROSTAT) 0.4 MG SL tablet Place 0.4 mg under the tongue every 5 (five) minutes as needed for chest pain.     No current facility-administered medications for this visit.    REVIEW OF SYSTEMS:   Constitutional: Denies fevers, chills or abnormal night sweats Eyes: Denies blurriness of vision, double vision or watery eyes Ears, nose, mouth, throat, and face: Denies mucositis or sore throat Respiratory: Denies cough, dyspnea or wheezes Cardiovascular: Denies palpitation, chest discomfort or lower extremity swelling Gastrointestinal:  Denies nausea, heartburn or change in bowel habits Skin: Denies abnormal skin rashes Lymphatics: Denies new lymphadenopathy or easy bruising Neurological:Denies numbness, tingling or new weaknesses Behavioral/Psych: Mood is stable, no new changes  All other systems were reviewed with the patient and are negative.  PHYSICAL EXAMINATION: ECOG PERFORMANCE STATUS: 0 - Asymptomatic  Filed Vitals:   07/03/14 1506  BP: 133/66  Pulse: 66  Temp: 98.5 F (36.9 C)  Resp: 18   Filed Weights   07/03/14 1506  Weight: 185 lb 4.8 oz (84.052 kg)    GENERAL:[name], no distress and comfortable SKIN: skin color, texture, turgor are normal, no rashes or significant lesions EYES: normal, conjunctiva are pink and non-injected, sclera clear OROPHARYNX:no exudate, no erythema and  lips, buccal mucosa, and tongue normal  NECK: supple, thyroid normal size, non-tender, without nodularity LYMPH:  no palpable lymphadenopathy in the cervical, axillary or inguinal LUNGS: clear to auscultation and percussion with normal breathing effort HEART: regular rate & rhythm and no murmurs and no lower extremity edema ABDOMEN:abdomen soft, non-tender and normal bowel sounds Musculoskeletal:no cyanosis of digits and no clubbing  PSYCH: [name] & oriented x 3 with fluent speech NEURO: no focal motor/sensory deficits Breasts: Breast inspection showed them to be symmetrical with no nipple discharge. Palpation of the breasts and axilla revealed no obvious mass that I could appreciate. The right breast and axillary surgical scar well healed.   LABORATORY DATA:  I have reviewed the data as listed Lab Results  Component Value Date   WBC 7.7 05/29/2014  HGB 14.5 05/29/2014   HCT 43.5 05/29/2014   MCV 82.0 05/29/2014   PLT 315.0 05/29/2014    Recent Labs  12/05/13 1643 02/20/14 1109 05/29/14 1645  NA 139 141 142  K 3.4* 3.4* 3.2*  CL 104 103 102  CO2 27 29 33*  GLUCOSE 96 131* 111*  BUN 9 11 8   CREATININE 1.1 0.99 0.96  CALCIUM 9.2 9.4 9.6  GFRNONAA  --  57*  --   GFRAA  --  66*  --   PROT 8.2  --  8.1  ALBUMIN 3.7  --  4.4  AST 23  --  17  ALT 19  --  16  ALKPHOS 116  --  137*  BILITOT 0.5  --  0.6  BILIDIR 0.1  --   --     RADIOGRAPHIC STUDIES: I have personally reviewed the radiological images as listed and agreed with the findings in the report. No results found.  ASSESSMENT & PLAN:  69 year old African-American female, with history of schizophrenia, coronary artery disease, hypertension, CHF, diabetes, hypothyroidism, and a personal history of breast cancer and throat cancer.   1. Right Breast cancer -Unfortunately I do not have her outside medical records during her initial visit. The patient she had node-positive right breast cancer, status post lumpectomy,  chemotherapy and radiation. It's been about 13 years since her initial diagnosis, I do not see a role of prolonged adjuvant antiestrogen therapy. So I'll not reveal her anastrozole. -We discussed at the surveillance plan. I recommend her to have mammogram once a year for which she is scheduled for next months, lab and exam with me once a year. -I encouraged her to have healthy diet, and the aggressive size. Also take calcium and vitamin D for bone health.  2. History of throat cancer -Her medical history is not again not available. We'll continue surveillance.  3. She will continue follow-up with her primary care physician for other medical issues. I encouraged her to get a psychiatrist and refill her Risperdal.  Follow-up -I'll see her back in one year with labs only -She will have mammograms next months I'll review her outside oncological records when I receive it. I'll call her afterwards if there are any change in her management plan.  All questions were answered. The patient knows to call the clinic with any problems, questions or concerns. I spent 40 minutes counseling the patient face to face. The total time spent in the appointment was 55 minutes and more than 50% was on counseling.     Truitt Merle, MD 07/03/2014 3:43 PM

## 2014-07-03 NOTE — Progress Notes (Signed)
Checked in new pt with no financial concerns.  Pt has 2 insurances so financial assistance may not be needed but she has my card for any billing questions or concerns.  ° °

## 2014-07-21 ENCOUNTER — Ambulatory Visit
Admission: RE | Admit: 2014-07-21 | Discharge: 2014-07-21 | Disposition: A | Discharge status: Home / Self Care | Payer: Medicare Other | Source: Ambulatory Visit

## 2014-07-21 DIAGNOSIS — Z1231 Encounter for screening mammogram for malignant neoplasm of breast: Secondary | ICD-10-CM

## 2014-07-25 ENCOUNTER — Encounter: Payer: Self-pay | Admitting: Family Medicine

## 2014-07-25 DIAGNOSIS — G4700 Insomnia, unspecified: Secondary | ICD-10-CM | POA: Insufficient documentation

## 2014-07-25 LAB — VITAMIN B12
Folic Acid: 8.3
VITAMIN B12 BIND CAP: 465

## 2014-07-26 ENCOUNTER — Encounter: Payer: Self-pay | Admitting: Family Medicine

## 2014-07-26 DIAGNOSIS — Z96659 Presence of unspecified artificial knee joint: Secondary | ICD-10-CM | POA: Insufficient documentation

## 2014-07-26 DIAGNOSIS — Z86010 Personal history of colonic polyps: Secondary | ICD-10-CM | POA: Insufficient documentation

## 2014-07-27 ENCOUNTER — Encounter: Payer: Self-pay | Admitting: Family Medicine

## 2014-08-31 ENCOUNTER — Ambulatory Visit (INDEPENDENT_AMBULATORY_CARE_PROVIDER_SITE_OTHER): Payer: Medicare Other | Admitting: Family Medicine

## 2014-08-31 ENCOUNTER — Encounter: Payer: Self-pay | Admitting: Family Medicine

## 2014-08-31 VITALS — BP 150/90 | HR 79 | Temp 98.6°F | Wt 180.0 lb

## 2014-08-31 DIAGNOSIS — I5022 Chronic systolic (congestive) heart failure: Secondary | ICD-10-CM | POA: Diagnosis not present

## 2014-08-31 DIAGNOSIS — E038 Other specified hypothyroidism: Secondary | ICD-10-CM | POA: Diagnosis not present

## 2014-08-31 DIAGNOSIS — E785 Hyperlipidemia, unspecified: Secondary | ICD-10-CM

## 2014-08-31 DIAGNOSIS — F209 Schizophrenia, unspecified: Secondary | ICD-10-CM

## 2014-08-31 DIAGNOSIS — I10 Essential (primary) hypertension: Secondary | ICD-10-CM

## 2014-08-31 MED ORDER — RISPERIDONE 1 MG PO TABS: 1.0000 mg | ORAL_TABLET | Freq: Every day | ORAL | Status: DC

## 2014-08-31 NOTE — Assessment & Plan Note (Signed)
S: previously controlled, minor poor control today BP Readings from Last 3 Encounters:  08/31/14 150/90  07/03/14 133/66  05/29/14 112/80  A/P: given last 2 readings controlled, we opted for home monitoring and follow up if elevated >140/90 before planned 3 month follow up.

## 2014-08-31 NOTE — Assessment & Plan Note (Addendum)
S: suspect poor control on labs though physically asymptomatic. plan had been to increase to levothyroxine 75 mcg daily. Patient reports not taking it as such, still alternating 50 and 75.  A/P: check TSH again, suspect will still be elevated and need to increase to levothyroxine 86mcg.

## 2014-08-31 NOTE — Patient Instructions (Addendum)
Update labs today  Please call to schedule cardiology follow up  I gave you 6 months worth of risperidal. You agreed to find a psychiatrist in that time.   Blood pressure up a bit. Advise you to get a home cuff and check at least twice a week. If regularly above 140/90 then come see me sooner.   See me back in 3 months or sooner as needed

## 2014-08-31 NOTE — Progress Notes (Signed)
Garret Reddish, MD  Subjective:  Alicia [name] is a 69 y.o. year old very pleasant female patient who presents with:  See problem oriented charting ROS- chest pain 1-2 x a month resolves with nitroglycerin, mild shortness of breath with this, DOE if does more than short term walking. Denies blurry vision or headaches  Past Medical History- CHF, CAD, schizophrenia, history breast cancer, HTN, hyperglycemia, HLD, hypothyroidism, GERD  Medications- reviewed and updated Current Outpatient Prescriptions  Medication Sig Dispense Refill  . atorvastatin (LIPITOR) 20 MG tablet Take 1 tablet (20 mg total) by mouth daily. 90 tablet 3  . calcium-vitamin D (OSCAL WITH D) 500-200 MG-UNIT per tablet Take 2 tablets by mouth every evening.    . furosemide (LASIX) 40 MG tablet Take 1 tablet (40 mg total) by mouth at bedtime. 30 tablet 3  . levothyroxine (SYNTHROID, LEVOTHROID) 75 MCG tablet Take 1 tablet (75 mcg total) by mouth every other day. Every other day alternating with 50 mcg 90 tablet 3  . Levothyroxine Sodium 50 MCG CAPS Every other day alternating with 75 mcg 30 capsule 3  . metoprolol (LOPRESSOR) 50 MG tablet Take 1/2 tablet in the morning and whole tablet in the evening. 45 tablet 3  . Multiple Vitamin (MULTIVITAMIN WITH MINERALS) TABS tablet Take 1 tablet by mouth daily.    . potassium chloride (K-DUR,KLOR-CON) 10 MEQ tablet Take 1 tablet (10 mEq total) by mouth daily. 90 tablet 3  . anastrozole (ARIMIDEX) 1 MG tablet Take 1 tablet (1 mg total) by mouth every evening. (Patient not taking: Reported on 07/03/2014) 90 tablet 3  . nitroGLYCERIN (NITROSTAT) 0.4 MG SL tablet Place 0.4 mg under the tongue every 5 (five) minutes as needed for chest pain.    . ranitidine (ZANTAC) 150 MG tablet Take 1 tablet (150 mg total) by mouth 2 (two) times daily. (Patient not taking: Reported on 08/31/2014) 90 tablet 3  . risperiDONE (RISPERDAL) 1 MG tablet Take 1 tablet (1 mg total) by mouth at bedtime. (Patient not  taking: Reported on 08/31/2014) 90 tablet 3   Objective: BP 150/90 mmHg  Pulse 79  Temp(Src) 98.6 F (37 C)  Wt 180 lb (81.647 kg) Gen: NAD, resting comfortably in chair Daughter present with patient CV: RRR no  rubs or gallops Lungs: CTAB no crackles, wheeze, rhonchi Abdomen: soft/nontender/nondistended/normal bowel sounds. Obese Ext: no edema Skin: warm, dry, no rash Neuro: grossly normal, moves all extremities   Assessment/Plan:  Schizophrenia S:Records did not mention schizophrenia but had been on risperdal. off risperidal for a year now, has not had any hallucinations. Not seeing shadows or things that are not there. Sits up all night watching tv then sleeps during the day. Slept much better on risperidal.  A/P: Had written rx last visit unclear why not filled. Refilled Risperdal for 6 months with agreement that patient would find psychiatry in that time frame.    Essential hypertension S: previously controlled, minor poor control today BP Readings from Last 3 Encounters:  08/31/14 150/90  07/03/14 133/66  05/29/14 112/80  A/P: given last 2 readings controlled, we opted for home monitoring and follow up if elevated >140/90 before planned 3 month follow up.    Hypothyroidism S: suspect poor control on labs though physically asymptomatic. plan had been to increase to levothyroxine 75 mcg daily. Patient reports not taking it as such, still alternating 50 and 75.  A/P: check TSH again, suspect will still be elevated and need to increase to levothyroxine 27mcg.  Chronic systolic congestive heart failure, NYHA class 4 S: appears controlled and euvolemic. Compliant with lasix.  A/P: continue lasix. Class III as struggling with walking more than short distances. Strongly advised cards follow up once again. There is some question as to why she never had myoview performed 11/2013 as planned. We will also check potassium as slightly hypokalemic last visit, may need to increase  daily use from 48meq potassium   Hyperlipidemia S: states compliant with atorvastatin, hopeful well controlled A/P: check direct LDL only as not fasting.    3 month f/u.   Orders Placed This Encounter  Procedures  . Basic metabolic panel    [place]  . TSH    [place]  . LDL cholesterol, direct    [place]    No orders of the defined types were placed in this encounter.

## 2014-08-31 NOTE — Assessment & Plan Note (Signed)
S: states compliant with atorvastatin, hopeful well controlled A/P: check direct LDL only as not fasting.

## 2014-08-31 NOTE — Assessment & Plan Note (Addendum)
S: appears controlled and euvolemic. Compliant with lasix.  A/P: continue lasix. Class III as struggling with walking more than short distances. Strongly advised cards follow up once again. There is some question as to why she never had myoview performed 11/2013 as planned. We will also check potassium as slightly hypokalemic last visit, may need to increase daily use from 33meq potassium

## 2014-08-31 NOTE — Assessment & Plan Note (Signed)
S:Records did not mention schizophrenia but had been on risperdal. off risperidal for a year now, has not had any hallucinations. Not seeing shadows or things that are not there. Sits up all night watching tv then sleeps during the day. Slept much better on risperidal.  A/P: Had written rx last visit unclear why not filled. Refilled Risperdal for 6 months with agreement that patient would find psychiatry in that time frame.

## 2014-09-01 LAB — BASIC METABOLIC PANEL
BUN: 9 mg/dL (ref 6–23)
CO2: 25 mEq/L (ref 19–32)
CREATININE: 0.92 mg/dL (ref 0.40–1.20)
Calcium: 9.4 mg/dL (ref 8.4–10.5)
Chloride: 104 mEq/L (ref 96–112)
GFR: 77.66 mL/min (ref >60.00)
GLUCOSE: 99 mg/dL (ref 70–99)
Potassium: 4.3 mEq/L (ref 3.5–5.1)
Sodium: 141 mEq/L (ref 135–145)

## 2014-09-01 LAB — LDL CHOLESTEROL, DIRECT: LDL DIRECT: 56 mg/dL

## 2014-09-01 LAB — TSH: TSH: 4.35 u[IU]/mL (ref 0.35–4.50)

## 2014-09-02 ENCOUNTER — Other Ambulatory Visit: Payer: Self-pay | Admitting: Family Medicine

## 2014-09-11 ENCOUNTER — Telehealth: Payer: Self-pay | Admitting: Cardiology

## 2014-09-11 MED ORDER — ISOSORBIDE MONONITRATE ER 30 MG PO TB24: 30.0000 mg | ORAL_TABLET | Freq: Every day | ORAL | Status: DC

## 2014-09-11 NOTE — Telephone Encounter (Signed)
Reviewed with Truitt Merle, NP who gave instructions for pt to start Imdur 30 mg by mouth daily and resume lopressor.  Also send message to Dr. Sherryl Barters nurse to see if sooner appt with Dr. Mare Ferrari available. Pt should go to ED if symptoms worsen. I spoke with pt and gave her these instructions.  Will send prescription for Imdur to [facility] on [address].  Pt asked about side effects and I told her headache is a possibility and she could take tylenol if needed. I made pt aware to follow label instructions for tylenol.

## 2014-09-11 NOTE — Telephone Encounter (Signed)
Spoke with Alicia [name]. She reports chest pain at 2 AM.  Relieved with 2 NTG.  No pain at present time and is feeling OK now.  First episode of chest pain was on 7/21 and was relieved with 2 NTG.  Saw Dr. Yong Channel and blood pressure was elevated at that time.  Alicia [name] reports being under a lot of stress due to family situations. Also reports chest pain on 7/28 that was relieved with 2 NTG.  Misses 2 doses of lasix last week due to traveling.  Out of Lopressor for last 3 days but is going to pick up at pharmacy and resume today.  Will review with provider in office.

## 2014-09-11 NOTE — Telephone Encounter (Signed)
New Message   Pt c/o of Chest Pain: STAT if CP now or developed within 24 hours  1. Are you having CP right now? NO  2. Are you experiencing any other symptoms (ex. SOB, nausea, vomiting, sweating)? NO  3. How long have you been experiencing CP?  08-31-14  4. Is your CP continuous or coming and going? It comes and goes  5. Have you taken Nitroglycerin? yes ?

## 2014-09-11 NOTE — Telephone Encounter (Signed)
Agree with plan 

## 2014-09-15 ENCOUNTER — Other Ambulatory Visit: Payer: Self-pay | Admitting: Cardiology

## 2014-09-18 ENCOUNTER — Telehealth: Payer: Self-pay | Admitting: Family Medicine

## 2014-09-18 MED ORDER — LEVOTHYROXINE SODIUM 50 MCG PO TABS: ORAL_TABLET | ORAL | Status: DC

## 2014-09-18 NOTE — Telephone Encounter (Signed)
Pt request refill of the following: levothyroxine (SYNTHROID, LEVOTHROID) 50 MCG tablet   Phamacy: [place]

## 2014-09-18 NOTE — Telephone Encounter (Signed)
Medication refilled

## 2014-11-15 ENCOUNTER — Encounter: Payer: Self-pay | Admitting: Cardiology

## 2014-11-15 ENCOUNTER — Ambulatory Visit (INDEPENDENT_AMBULATORY_CARE_PROVIDER_SITE_OTHER): Payer: Medicare Other | Admitting: Cardiology

## 2014-11-15 VITALS — BP 130/78 | HR 82 | Ht 61.0 in | Wt 188.1 lb

## 2014-11-15 DIAGNOSIS — R011 Cardiac murmur, unspecified: Secondary | ICD-10-CM

## 2014-11-15 DIAGNOSIS — I259 Chronic ischemic heart disease, unspecified: Secondary | ICD-10-CM | POA: Diagnosis not present

## 2014-11-15 DIAGNOSIS — Z853 Personal history of malignant neoplasm of breast: Secondary | ICD-10-CM

## 2014-11-15 DIAGNOSIS — I10 Essential (primary) hypertension: Secondary | ICD-10-CM | POA: Diagnosis not present

## 2014-11-15 NOTE — Progress Notes (Signed)
Cardiology Office Note   Date:  11/15/2014   ID:  Alicia [name] [birth year], MRN [id number]  PCP:  Garret Reddish, MD  Cardiologist: Darlin Coco MD  No chief complaint on file.     History of Present Illness: Alicia [name] is a 69 y.o. female who presents for a one-year follow-up office visit This pleasant 69 year old African American woman is seen for a one-year follow-up visit.. . She is living with her daughter. She comes to be established with cardiology. She gives a history of having had congestive heart failure in 1990. In 1993 she had a heart attack treated with a balloon and the stent. We don't have any of those details. She has a history of borderline diabetes. She has a history of high cholesterol and a history of elevated blood pressure. She has never had a stroke. She has a history of previous breast cancer of the right breast and had a partial mastectomy followed by chemotherapy and radiation.  She was treated with Arimidex for 5 years and has finished it She has a history of hypothyroidism and takes 50 mcg of Synthroid one daily and 75 mcg the next. In early August she had run out of her metoprolol and began to have chest discomfort.  She called the office and was started on isosorbide mononitrate 30 mg daily and her metoprolol was restarted and since then she has been doing well.  She has been gaining weight.  She is sedentary according to her daughter who would like her to be more physically active.  The patient drinks a lot of sodas  Past Medical History  Diagnosis Date  . CHF (congestive heart failure) ([place])   . Thyroid disease   . Hypertension   . Diabetes mellitus without complication ([place])   . Cancer ([place])   . Heart disease   . Asthma     childhood  . Heart murmur     echo without obvious valvular issues  . Hyperlipidemia   . Kidney stone   . CAD (coronary artery disease)   . Paresthesias     radiculopaty L reported. R radiculopathy  reporetd anothe rtime. Improved wt. loss and exercise. MRI lumbar scanned in.     Past Surgical History  Procedure Laterality Date  . Knee surgery      knee replacement L  . Thyroid surgery      surgery x2, adioactive iodine afte rreportedly 2006  . Lung stapled to ribs      pneumothorax x 2  . Breast surgery    . Lymphnodes Right     axillary  . Ectopic pregnancy surgery       Current Outpatient Prescriptions  Medication Sig Dispense Refill  . atorvastatin (LIPITOR) 20 MG tablet Take 1 tablet (20 mg total) by mouth daily. 90 tablet 3  . calcium-vitamin D (OSCAL WITH D) 500-200 MG-UNIT per tablet Take 2 tablets by mouth every evening.    . furosemide (LASIX) 40 MG tablet Take 1 tablet (40 mg total) by mouth at bedtime. 30 tablet 3  . isosorbide mononitrate (IMDUR) 30 MG 24 hr tablet Take 1 tablet (30 mg total) by mouth daily. 30 tablet 3  . levothyroxine (SYNTHROID, LEVOTHROID) 75 MCG tablet Take 1 tablet (75 mcg total) by mouth every other day. Every other day alternating with 50 mcg 90 tablet 3  . Levothyroxine Sodium 50 MCG CAPS Every other day alternating with 75 mcg 30 capsule 3  . metoprolol (LOPRESSOR) 50 MG tablet  Take by mouth. Take 1 tablet by mouth in the morning and 1/2 tablet by mouth in the evening    . Multiple Vitamin (MULTIVITAMIN WITH MINERALS) TABS tablet Take 1 tablet by mouth daily.    . nitroGLYCERIN (NITROSTAT) 0.4 MG SL tablet Place 0.4 mg under the tongue every 5 (five) minutes as needed for chest pain.    . potassium chloride (K-DUR,KLOR-CON) 10 MEQ tablet Take 1 tablet (10 mEq total) by mouth daily. 90 tablet 3  . ranitidine (ZANTAC) 150 MG tablet Take 1 tablet (150 mg total) by mouth 2 (two) times daily. 90 tablet 3  . risperiDONE (RISPERDAL) 1 MG tablet Take 1 tablet (1 mg total) by mouth at bedtime. 90 tablet 1   No current facility-administered medications for this visit.    Allergies:   Lisinopril    Social History:  The patient  reports that she  has never smoked. She does not have any smokeless tobacco history on file. She reports that she does not drink alcohol or use illicit drugs.   Family History:  The patient's family history includes Cancer in her maternal grandfather; Cancer (age of onset: 58) in her sister; Heart attack (age of onset: 22) in her father; Heart attack (age of onset: 63) in her mother; High blood pressure in her father; Hyperlipidemia in her mother; Stroke (age of onset: 26) in her father.    ROS:  Please see the history of present illness.   Otherwise, review of systems are positive for none.   All other systems are reviewed and negative.    PHYSICAL EXAM: VS:  BP 130/78 mmHg  Pulse 82  Ht 5\' 1"  (1.549 m)  Wt 188 lb 1.9 oz (85.331 kg)  BMI 35.56 kg/m2 , BMI Body mass index is 35.56 kg/(m^2). GEN: Well nourished, well developed, in no acute distress HEENT: normal Neck: no JVD, carotid bruits, or masses Cardiac: RRR; no murmurs, rubs, or gallops,no edema  Respiratory:  clear to auscultation bilaterally, normal work of breathing GI: soft, nontender, nondistended, + BS MS: no deformity or atrophy Skin: warm and dry, no rash Neuro:  Strength and sensation are intact Psych: euthymic mood, full affect   EKG:  EKG is ordered today. The ekg ordered today demonstrates normal sinus rhythm.  Low voltage QRS.  No ischemic changes.   Recent Labs: 02/20/2014: B Natriuretic Peptide 34.3 05/29/2014: ALT 16; Hemoglobin 14.5; Platelets 315.0 08/31/2014: BUN 9; Creatinine, Ser 0.92; Potassium 4.3; Sodium 141; TSH 4.35    Lipid Panel    Component Value Date/Time   CHOL 191 12/05/2013 1643   TRIG 225.0* 12/05/2013 1643   HDL 37.80* 12/05/2013 1643   CHOLHDL 5 12/05/2013 1643   VLDL 45.0* 12/05/2013 1643   LDLDIRECT 56.0 08/31/2014 1600      Wt Readings from Last 3 Encounters:  11/15/14 188 lb 1.9 oz (85.331 kg)  08/31/14 180 lb (81.647 kg)  07/03/14 185 lb 4.8 oz (84.052 kg)       ASSESSMENT AND  PLAN:  1. History of ischemic heart disease status post acute 1993 treated in [place] with balloon and stent 2. history of congestive heart failure onset 1990. Details not available 3. history of hyperglycemia and borderline diabetes 4. Hypercholesterolemia 5. Hypothyroidism 6. history of right breast cancer treated with partial mastectomy, chemotherapy and radiation. Details not known. Has finished 5 years of Arimidex.   Current medicines are reviewed at length with the patient today.  The patient does not have concerns regarding medicines.  The following changes have been made:  no change  Labs/ tests ordered today include:   Orders Placed This Encounter  Procedures  . EKG 12-Lead    His physician: 100 to try to lose weight.  She needs to watch her diet carefully.  She will continue current medication.  Recheck in one year for follow-up office visit and EKG.  Berna Spare MD 11/15/2014 6:51 PM    [address], [place],   [address] Phone: [phone]; Fax: [phone]

## 2014-11-15 NOTE — Patient Instructions (Signed)
Medication Instructions:  Your physician recommends that you continue on your current medications as directed. Please refer to the Current Medication list given to you today.  Labwork: NONE  Testing/Procedures: NONE  Follow-Up: Your physician wants you to follow-up in: 1 YEAR OV/EKG  You will receive a reminder letter in the mail two months in advance. If you don't receive a letter, please call our office to schedule the follow-up appointment.    

## 2014-12-01 ENCOUNTER — Ambulatory Visit: Payer: Medicare Other | Admitting: Family Medicine

## 2014-12-07 ENCOUNTER — Ambulatory Visit (INDEPENDENT_AMBULATORY_CARE_PROVIDER_SITE_OTHER): Payer: Medicare Other | Admitting: Family Medicine

## 2014-12-07 ENCOUNTER — Encounter: Payer: Self-pay | Admitting: Family Medicine

## 2014-12-07 VITALS — BP 140/76 | HR 72 | Temp 98.1°F | Wt 189.0 lb

## 2014-12-07 DIAGNOSIS — Z780 Asymptomatic menopausal state: Secondary | ICD-10-CM

## 2014-12-07 DIAGNOSIS — I10 Essential (primary) hypertension: Secondary | ICD-10-CM | POA: Diagnosis not present

## 2014-12-07 DIAGNOSIS — E7800 Pure hypercholesterolemia, unspecified: Secondary | ICD-10-CM | POA: Diagnosis not present

## 2014-12-07 DIAGNOSIS — F209 Schizophrenia, unspecified: Secondary | ICD-10-CM

## 2014-12-07 DIAGNOSIS — E038 Other specified hypothyroidism: Secondary | ICD-10-CM

## 2014-12-07 MED ORDER — RANITIDINE HCL 150 MG PO TABS: 150.0000 mg | ORAL_TABLET | Freq: Two times a day (BID) | ORAL | Status: DC

## 2014-12-07 MED ORDER — NITROGLYCERIN 0.4 MG SL SUBL: 0.4000 mg | SUBLINGUAL_TABLET | SUBLINGUAL | Status: DC | PRN

## 2014-12-07 MED ORDER — LEVOTHYROXINE SODIUM 50 MCG PO TABS: 50.0000 ug | ORAL_TABLET | Freq: Every day | ORAL | Status: DC

## 2014-12-07 MED ORDER — METOPROLOL TARTRATE 50 MG PO TABS
50.0000 mg | ORAL_TABLET | Freq: Two times a day (BID) | ORAL | Status: DC
Start: 2014-12-07 — End: 2015-10-08

## 2014-12-07 MED ORDER — FUROSEMIDE 40 MG PO TABS: 40.0000 mg | ORAL_TABLET | Freq: Every day | ORAL | Status: DC

## 2014-12-07 MED ORDER — POTASSIUM CHLORIDE CRYS ER 10 MEQ PO TBCR: 10.0000 meq | EXTENDED_RELEASE_TABLET | Freq: Every day | ORAL | Status: AC

## 2014-12-07 MED ORDER — LEVOTHYROXINE SODIUM 75 MCG PO TABS: 75.0000 ug | ORAL_TABLET | ORAL | Status: DC

## 2014-12-07 MED ORDER — ISOSORBIDE MONONITRATE ER 30 MG PO TB24: 30.0000 mg | ORAL_TABLET | Freq: Every day | ORAL | Status: DC

## 2014-12-07 MED ORDER — ATORVASTATIN CALCIUM 20 MG PO TABS: 20.0000 mg | ORAL_TABLET | Freq: Every day | ORAL | Status: DC

## 2014-12-07 NOTE — Patient Instructions (Signed)
Schedule bone density at front desk  Refilled all medicine but risperdal  Still advise you to see psychiatry  Take metoprolol full tab in morning and evening instead of just 1/2 tab in AM  See me in 3 months  Health Maintenance Due  Topic Date Due  . Hepatitis C Screening  August 28, 1945  . DEXA SCAN  11/09/2009

## 2014-12-07 NOTE — Progress Notes (Signed)
Garret Reddish, MD  Subjective:  Alicia [name] is a 69 y.o. year old very pleasant female patient who presents for/with See problem oriented charting ROS- no chest pain or shortness of breath. No SI/HI. No hallucinations.   Past Medical History-  Patient Active Problem List   Diagnosis Date Noted  . Schizophrenia ([place]) 05/29/2014    Priority: High  . Ischemic heart disease 12/05/2013    Priority: High  . Chronic systolic congestive heart failure, NYHA class 4 ([place]) 12/05/2013    Priority: High  . Hyperlipidemia 05/29/2014    Priority: Medium  . Essential hypertension 12/05/2013    Priority: Medium  . Hypothyroidism 12/05/2013    Priority: Medium  . History of breast cancer in female 12/05/2013    Priority: Medium  . Hyperglycemia 12/05/2013    Priority: Medium  . History of knee replacement 07/26/2014    Priority: Low  . GERD (gastroesophageal reflux disease) 12/05/2013    Priority: Low  . History of colonic polyps 07/26/2014  . History of throat cancer 07/03/2014  . Heart murmur 12/05/2013    Medications- reviewed and updated Current Outpatient Prescriptions  Medication Sig Dispense Refill  . atorvastatin (LIPITOR) 20 MG tablet Take 1 tablet (20 mg total) by mouth daily. 90 tablet 3  . calcium-vitamin D (OSCAL WITH D) 500-200 MG-UNIT per tablet Take 2 tablets by mouth every evening.    . furosemide (LASIX) 40 MG tablet Take 1 tablet (40 mg total) by mouth daily. 90 tablet 3  . isosorbide mononitrate (IMDUR) 30 MG 24 hr tablet Take 1 tablet (30 mg total) by mouth daily. 90 tablet 3  . levothyroxine (SYNTHROID, LEVOTHROID) 50 MCG tablet Take 1 tablet (50 mcg total) by mouth daily before breakfast. Every other day alternating with 75 mcg 45 tablet 3  . levothyroxine (SYNTHROID, LEVOTHROID) 75 MCG tablet Take 1 tablet (75 mcg total) by mouth every other day. Every other day alternating with 50 mcg 45 tablet 3  . metoprolol (LOPRESSOR) 50 MG tablet Take 1 tablet (50 mg total)  by mouth 2 (two) times daily. 180 tablet 3  . Multiple Vitamin (MULTIVITAMIN WITH MINERALS) TABS tablet Take 1 tablet by mouth daily.    . potassium chloride (K-DUR,KLOR-CON) 10 MEQ tablet Take 1 tablet (10 mEq total) by mouth daily. 90 tablet 3  . ranitidine (ZANTAC) 150 MG tablet Take 1 tablet (150 mg total) by mouth 2 (two) times daily. 90 tablet 3  . risperiDONE (RISPERDAL) 1 MG tablet Take 1 tablet (1 mg total) by mouth at bedtime. 90 tablet 1  . nitroGLYCERIN (NITROSTAT) 0.4 MG SL tablet Place 1 tablet (0.4 mg total) under the tongue every 5 (five) minutes as needed for chest pain (max 3 in 15 minutes). 30 tablet 12   No current facility-administered medications for this visit.    Objective: BP 140/76 mmHg  Pulse 72  Temp(Src) 98.1 F (36.7 C)  Wt 189 lb (85.73 kg) Gen: NAD, resting comfortably CV: RRR no murmurs rubs or gallops Lungs: CTAB no crackles, wheeze, rhonchi Abdomen: soft/nontender/nondistended/normal bowel sounds. No rebound or guarding.  Ext: no edema Skin: warm, dry Neuro: grossly normal, moves all extremities  Assessment/Plan:  Essential hypertension S: poorly controlled on Metoprolol 25mg  AM and  50mg  PM, lasix 40mg , as well as imdur BP Readings from Last 3 Encounters:  12/07/14 140/76  11/15/14 130/78  08/31/14 150/90  A/P:Continue current meds but increase metoprolol to 50mg  BID and follow up within a month   Hypothyroidism S: poor  control previously and had planned to increase levothyroxine to 75 mcg. Patient never increased but continued alternating 75 and 50 mcg. TSH returned to normal, may have been compliance issue Lab Results  Component Value Date   TSH 4.35 08/31/2014  A/P: continue current dose of medication, recheck TSH at follow up    Schizophrenia S: since restarting risperdal, has been more tired and less active. No hallucinations in this time. No SI/HI A/P: still strongly encouraged patient to establish with psychiatry. Has 3 months  worth of rx, i will give 3 more months but needs to have appointment scheduled within 6 months   3 months Return precautions advised.   Orders Placed This Encounter  Procedures  . DG Bone Density    Standing Status: Future     Number of Occurrences:      Standing Expiration Date: 02/06/2016    Order Specific Question:  Reason for Exam (SYMPTOM  OR DIAGNOSIS REQUIRED)    Answer:  post menopausal    Order Specific Question:  Preferred imaging location?    Answer:  [facility]    Meds ordered this encounter  Medications  . atorvastatin (LIPITOR) 20 MG tablet    Sig: Take 1 tablet (20 mg total) by mouth daily.    Dispense:  90 tablet    Refill:  3  . ranitidine (ZANTAC) 150 MG tablet    Sig: Take 1 tablet (150 mg total) by mouth 2 (two) times daily.    Dispense:  90 tablet    Refill:  3  . potassium chloride (K-DUR,KLOR-CON) 10 MEQ tablet    Sig: Take 1 tablet (10 mEq total) by mouth daily.    Dispense:  90 tablet    Refill:  3  . metoprolol (LOPRESSOR) 50 MG tablet    Sig: Take 1 tablet (50 mg total) by mouth 2 (two) times daily.    Dispense:  180 tablet    Refill:  3  . levothyroxine (SYNTHROID, LEVOTHROID) 50 MCG tablet    Sig: Take 1 tablet (50 mcg total) by mouth daily before breakfast. Every other day alternating with 75 mcg    Dispense:  45 tablet    Refill:  3  . levothyroxine (SYNTHROID, LEVOTHROID) 75 MCG tablet    Sig: Take 1 tablet (75 mcg total) by mouth every other day. Every other day alternating with 50 mcg    Dispense:  45 tablet    Refill:  3  . isosorbide mononitrate (IMDUR) 30 MG 24 hr tablet    Sig: Take 1 tablet (30 mg total) by mouth daily.    Dispense:  90 tablet    Refill:  3  . furosemide (LASIX) 40 MG tablet    Sig: Take 1 tablet (40 mg total) by mouth daily.    Dispense:  90 tablet    Refill:  3  . nitroGLYCERIN (NITROSTAT) 0.4 MG SL tablet    Sig: Place 1 tablet (0.4 mg total) under the tongue every 5 (five) minutes as needed  for chest pain (max 3 in 15 minutes).    Dispense:  30 tablet    Refill:  12

## 2014-12-08 NOTE — Assessment & Plan Note (Signed)
S: poorly controlled on Metoprolol 25mg  AM and  50mg  PM, lasix 40mg , as well as imdur BP Readings from Last 3 Encounters:  12/07/14 140/76  11/15/14 130/78  08/31/14 150/90  A/P:Continue current meds but increase metoprolol to 50mg  BID and follow up within a month

## 2014-12-08 NOTE — Assessment & Plan Note (Signed)
S: since restarting risperdal, has been more tired and less active. No hallucinations in this time. No SI/HI A/P: still strongly encouraged patient to establish with psychiatry. Has 3 months worth of rx, i will give 3 more months but needs to have appointment scheduled within 6 months

## 2014-12-08 NOTE — Assessment & Plan Note (Signed)
S: poor control previously and had planned to increase levothyroxine to 75 mcg. Patient never increased but continued alternating 75 and 50 mcg. TSH returned to normal, may have been compliance issue Lab Results  Component Value Date   TSH 4.35 08/31/2014  A/P: continue current dose of medication, recheck TSH at follow up

## 2014-12-13 ENCOUNTER — Ambulatory Visit (INDEPENDENT_AMBULATORY_CARE_PROVIDER_SITE_OTHER)
Admission: RE | Admit: 2014-12-13 | Discharge: 2014-12-13 | Disposition: A | Discharge status: Home / Self Care | Payer: Medicare Other | Source: Ambulatory Visit | Attending: Family Medicine | Admitting: Family Medicine

## 2014-12-13 DIAGNOSIS — Z780 Asymptomatic menopausal state: Secondary | ICD-10-CM

## 2014-12-13 DIAGNOSIS — M8580 Other specified disorders of bone density and structure, unspecified site: Secondary | ICD-10-CM

## 2014-12-20 DIAGNOSIS — M8580 Other specified disorders of bone density and structure, unspecified site: Secondary | ICD-10-CM | POA: Insufficient documentation

## 2015-03-09 ENCOUNTER — Encounter: Payer: Self-pay | Admitting: Family Medicine

## 2015-03-09 ENCOUNTER — Ambulatory Visit (INDEPENDENT_AMBULATORY_CARE_PROVIDER_SITE_OTHER): Payer: Medicare Other | Admitting: Family Medicine

## 2015-03-09 ENCOUNTER — Other Ambulatory Visit: Payer: Self-pay | Admitting: Family Medicine

## 2015-03-09 VITALS — BP 138/72 | HR 74 | Temp 98.8°F | Wt 194.0 lb

## 2015-03-09 DIAGNOSIS — E038 Other specified hypothyroidism: Secondary | ICD-10-CM | POA: Diagnosis not present

## 2015-03-09 DIAGNOSIS — Z20828 Contact with and (suspected) exposure to other viral communicable diseases: Secondary | ICD-10-CM

## 2015-03-09 DIAGNOSIS — F209 Schizophrenia, unspecified: Secondary | ICD-10-CM | POA: Diagnosis not present

## 2015-03-09 DIAGNOSIS — I10 Essential (primary) hypertension: Secondary | ICD-10-CM

## 2015-03-09 LAB — CBC
HEMATOCRIT: 43.9 % (ref 36.0–46.0)
HEMOGLOBIN: 14.4 g/dL (ref 12.0–15.0)
MCHC: 32.8 g/dL (ref 30.0–36.0)
MCV: 84.4 fl (ref 78.0–100.0)
PLATELETS: 280 10*3/uL (ref 150.0–400.0)
RBC: 5.21 Mil/uL — ABNORMAL HIGH (ref 3.87–5.11)
RDW: 15.3 % (ref 11.5–15.5)
WBC: 8.6 10*3/uL (ref 4.0–10.5)

## 2015-03-09 LAB — BASIC METABOLIC PANEL
BUN: 8 mg/dL (ref 6–23)
CHLORIDE: 101 meq/L (ref 96–112)
CO2: 30 meq/L (ref 19–32)
CREATININE: 1.01 mg/dL (ref 0.40–1.20)
Calcium: 9.3 mg/dL (ref 8.4–10.5)
GFR: 69.62 mL/min (ref >60.00)
Glucose, Bld: 128 mg/dL — ABNORMAL HIGH (ref 70–99)
POTASSIUM: 3.5 meq/L (ref 3.5–5.1)
Sodium: 141 mEq/L (ref 135–145)

## 2015-03-09 LAB — TSH: TSH: 22.72 u[IU]/mL — ABNORMAL HIGH (ref 0.35–4.50)

## 2015-03-09 MED ORDER — RISPERIDONE 1 MG PO TABS: 1.0000 mg | ORAL_TABLET | Freq: Every day | ORAL | Status: DC

## 2015-03-09 NOTE — Assessment & Plan Note (Signed)
S: Patient has made little progress in finding psychiatrist though claims her other daughter is helping her. Is about to run out of risperidal. No SI/HI. Denies hallucinations. Does seem tired on medicine A/P: refilled risperdal- emphasized need for psychiatry and the plan was that if no psychiatry evaluation by next visit to stop refilling medicine though better option likely to refill on monthly basis and have office visits.

## 2015-03-09 NOTE — Progress Notes (Signed)
Garret Reddish, MD  Subjective:  Alicia [name] is a 70 y.o. year old very pleasant female patient who presents for/with See problem oriented charting ROS- deniesSI/HI. No hallucinations. No chest pain or shortness of breath butrather sedentary. No headache or blurry vision.   Past Medical History-  Patient Active Problem List   Diagnosis Date Noted  . Schizophrenia ([place]) 05/29/2014    Priority: High  . Ischemic heart disease 12/05/2013    Priority: High  . Chronic systolic congestive heart failure, NYHA class 4 ([place]) 12/05/2013    Priority: High  . Hyperlipidemia 05/29/2014    Priority: Medium  . Essential hypertension 12/05/2013    Priority: Medium  . Hypothyroidism 12/05/2013    Priority: Medium  . History of breast cancer in female 12/05/2013    Priority: Medium  . Hyperglycemia 12/05/2013    Priority: Medium  . History of knee replacement 07/26/2014    Priority: Low  . GERD (gastroesophageal reflux disease) 12/05/2013    Priority: Low  . Osteopenia 12/20/2014  . History of colonic polyps 07/26/2014  . History of throat cancer 07/03/2014  . Heart murmur 12/05/2013    Medications- reviewed and updated Current Outpatient Prescriptions  Medication Sig Dispense Refill  . atorvastatin (LIPITOR) 20 MG tablet Take 1 tablet (20 mg total) by mouth daily. 90 tablet 3  . calcium-vitamin D (OSCAL WITH D) 500-200 MG-UNIT per tablet Take 2 tablets by mouth every evening.    . furosemide (LASIX) 40 MG tablet Take 1 tablet (40 mg total) by mouth daily. 90 tablet 3  . isosorbide mononitrate (IMDUR) 30 MG 24 hr tablet Take 1 tablet (30 mg total) by mouth daily. 90 tablet 3  . levothyroxine (SYNTHROID, LEVOTHROID) 50 MCG tablet Take 1 tablet (50 mcg total) by mouth daily before breakfast. Every other day alternating with 75 mcg 45 tablet 3  . levothyroxine (SYNTHROID, LEVOTHROID) 75 MCG tablet Take 1 tablet (75 mcg total) by mouth every other day. Every other day alternating with 50 mcg  45 tablet 3  . metoprolol (LOPRESSOR) 50 MG tablet Take 1 tablet (50 mg total) by mouth 2 (two) times daily. 180 tablet 3  . Multiple Vitamin (MULTIVITAMIN WITH MINERALS) TABS tablet Take 1 tablet by mouth daily.    . potassium chloride (K-DUR,KLOR-CON) 10 MEQ tablet Take 1 tablet (10 mEq total) by mouth daily. 90 tablet 3  . ranitidine (ZANTAC) 150 MG tablet Take 1 tablet (150 mg total) by mouth 2 (two) times daily. 90 tablet 3  . risperiDONE (RISPERDAL) 1 MG tablet Take 1 tablet (1 mg total) by mouth at bedtime. 90 tablet 0  . nitroGLYCERIN (NITROSTAT) 0.4 MG SL tablet Place 1 tablet (0.4 mg total) under the tongue every 5 (five) minutes as needed for chest pain (max 3 in 15 minutes). (Patient not taking: Reported on 03/09/2015) 30 tablet 12   No current facility-administered medications for this visit.    Objective: BP 138/72 mmHg  Pulse 74  Temp(Src) 98.8 F (37.1 C)  Wt 194 lb (87.998 kg) Gen: NAD, resting comfortably CV: RRR no murmurs rubs or gallops Lungs: CTAB no crackles, wheeze, rhonchi Abdomen: soft/nontender/nondistended/normal bowel sounds. Ext: no edema Skin: warm, dry Neuro: grossly normal, moves all extremities Psych: nondepressed mood  Assessment/Plan:  Schizophrenia S: Patient has made little progress in finding psychiatrist though claims her other daughter is helping her. Is about to run out of risperidal. No SI/HI. Denies hallucinations. Does seem tired on medicine A/P: refilled risperdal- emphasized need for psychiatry  and the plan was that if no psychiatry evaluation by next visit to stop refilling medicine though better option likely to refill on monthly basis and have office visits.    Hypothyroidism S: poorly controlled TSH. Claims compliance at visit with 75 and 45mcg on alternating days Lab Results  Component Value Date   TSH 22.72* 03/09/2015  A/P: will have Keba verify with patient once more- if she has been taking this dose- increase to 88 mcg daily  and recheck 6 weks  Essential hypertension S: controlled. On repeat though initial 148/92. She seems to hover right at border between controlled and uncontrolled   BP Readings from Last 3 Encounters:  03/09/15 138/72  12/07/14 140/76  11/15/14 130/78  A/P:Continue current meds:  Metoprolol 50mg  BID, lasix 40mg , imdur. Has gained weight- push for exercise though low motivation and needs to improve diet- advised    3 months verbal instruction Return precautions advised.   Orders Placed This Encounter  Procedures  . CBC    [facility]  . Basic metabolic panel    [facility]  . TSH    [facility]  . Hepatitis C antibody, reflex    [facility]    Meds ordered this encounter  Medications  . risperiDONE (RISPERDAL) 1 MG tablet    Sig: Take 1 tablet (1 mg total) by mouth at bedtime.    Dispense:  90 tablet    Refill:  0

## 2015-03-09 NOTE — Assessment & Plan Note (Signed)
S: poorly controlled TSH. Claims compliance at visit with 75 and 40mcg on alternating days Lab Results  Component Value Date   TSH 22.72* 03/09/2015  A/P: will have Keba verify with patient once more- if she has been taking this dose- increase to 88 mcg daily and recheck 6 weks

## 2015-03-09 NOTE — Assessment & Plan Note (Signed)
S: controlled. On repeat though initial 148/92. She seems to hover right at border between controlled and uncontrolled   BP Readings from Last 3 Encounters:  03/09/15 138/72  12/07/14 140/76  11/15/14 130/78  A/P:Continue current meds:  Metoprolol 50mg  BID, lasix 40mg , imdur. Has gained weight- push for exercise though low motivation and needs to improve diet- advised

## 2015-03-09 NOTE — Patient Instructions (Signed)
I refilled your risperdal- you agreed to set up psychiatry visit between now and next visit in 3 months  Blood pressure ok on recheck but you run towards upper limit of normal. Weight loss, regular exercise would help  Blood work before you go

## 2015-03-10 ENCOUNTER — Other Ambulatory Visit: Payer: Self-pay | Admitting: Family Medicine

## 2015-03-10 ENCOUNTER — Other Ambulatory Visit: Payer: Self-pay

## 2015-03-10 DIAGNOSIS — E038 Other specified hypothyroidism: Secondary | ICD-10-CM

## 2015-03-10 LAB — HEPATITIS C ANTIBODY: HCV AB: NEGATIVE

## 2015-03-10 MED ORDER — LEVOTHYROXINE SODIUM 88 MCG PO TABS: 88.0000 ug | ORAL_TABLET | Freq: Every day | ORAL | Status: DC

## 2015-06-08 ENCOUNTER — Ambulatory Visit: Payer: Medicare Other | Admitting: Family Medicine

## 2015-06-08 ENCOUNTER — Telehealth: Payer: Self-pay | Admitting: Hematology

## 2015-06-08 NOTE — Telephone Encounter (Signed)
cld & spoke to daughter to adv appt was r/s on 5/23 to 10:30-daughter understood

## 2015-06-14 ENCOUNTER — Ambulatory Visit: Payer: Medicare Other | Admitting: Family Medicine

## 2015-06-22 ENCOUNTER — Ambulatory Visit (INDEPENDENT_AMBULATORY_CARE_PROVIDER_SITE_OTHER): Payer: Medicare Other | Admitting: Family Medicine

## 2015-06-22 ENCOUNTER — Encounter: Payer: Self-pay | Admitting: Family Medicine

## 2015-06-22 VITALS — BP 136/70 | HR 89 | Temp 98.5°F | Wt 198.0 lb

## 2015-06-22 DIAGNOSIS — I5022 Chronic systolic (congestive) heart failure: Secondary | ICD-10-CM | POA: Diagnosis not present

## 2015-06-22 DIAGNOSIS — F209 Schizophrenia, unspecified: Secondary | ICD-10-CM | POA: Diagnosis not present

## 2015-06-22 DIAGNOSIS — I10 Essential (primary) hypertension: Secondary | ICD-10-CM

## 2015-06-22 DIAGNOSIS — E038 Other specified hypothyroidism: Secondary | ICD-10-CM | POA: Diagnosis not present

## 2015-06-22 MED ORDER — RISPERIDONE 1 MG PO TABS: 1.0000 mg | ORAL_TABLET | Freq: Every day | ORAL | Status: AC

## 2015-06-22 MED ORDER — RANITIDINE HCL 150 MG PO TABS: 150.0000 mg | ORAL_TABLET | Freq: Two times a day (BID) | ORAL | Status: DC

## 2015-06-22 NOTE — Patient Instructions (Addendum)
I refilled your risperdal- you agreed to set up psychiatry visit between now and next visit in 6 weeks.   Blood pressure ok on recheck but you run towards upper limit of normal. Weight loss, regular exercise would help. This is very important for you. I think deconditioning may be increasing your shortness of breath as well. If you have weight gain of 5 lbs in 3 days or 3 lbs in a day or worsening shortness of breath, need to seek care immediately- weight gain could also be from fluid increase but did not appear to be cause on exam today.   I would love for you to find a meaningful way to contribute in the house as well whether that is cleaning, helping out with kids, or in another way- "if you dont move it you lose it" and we want you to keep it!   Refilled zantac  Recheck thyroid next visit

## 2015-06-22 NOTE — Progress Notes (Signed)
Subjective:  Alicia [name] is a 70 y.o. year old very pleasant female patient who presents for/with See problem oriented charting ROS- No chest pain. No hallucinations/SI/HI.  No headache or blurry vision.  .see any ROS included in HPI as well.   Past Medical History-  Patient Active Problem List   Diagnosis Date Noted  . Schizophrenia ([place]) 05/29/2014    Priority: High  . Ischemic heart disease 12/05/2013    Priority: High  . Chronic systolic congestive heart failure, NYHA class 4 ([place]) 12/05/2013    Priority: High  . Hyperlipidemia 05/29/2014    Priority: Medium  . Essential hypertension 12/05/2013    Priority: Medium  . Hypothyroidism 12/05/2013    Priority: Medium  . History of breast cancer in female 12/05/2013    Priority: Medium  . Hyperglycemia 12/05/2013    Priority: Medium  . History of knee replacement 07/26/2014    Priority: Low  . GERD (gastroesophageal reflux disease) 12/05/2013    Priority: Low  . Osteopenia 12/20/2014  . History of colonic polyps 07/26/2014  . History of throat cancer 07/03/2014  . Heart murmur 12/05/2013    Medications- reviewed and updated Current Outpatient Prescriptions  Medication Sig Dispense Refill  . atorvastatin (LIPITOR) 20 MG tablet Take 1 tablet (20 mg total) by mouth daily. 90 tablet 3  . calcium-vitamin D (OSCAL WITH D) 500-200 MG-UNIT per tablet Take 2 tablets by mouth every evening.    . furosemide (LASIX) 40 MG tablet Take 1 tablet (40 mg total) by mouth daily. 90 tablet 3  . isosorbide mononitrate (IMDUR) 30 MG 24 hr tablet Take 1 tablet (30 mg total) by mouth daily. 90 tablet 3  . levothyroxine (SYNTHROID, LEVOTHROID) 88 MCG tablet Take 1 tablet (88 mcg total) by mouth daily. 90 tablet 3  . metoprolol (LOPRESSOR) 50 MG tablet Take 1 tablet (50 mg total) by mouth 2 (two) times daily. 180 tablet 3  . Multiple Vitamin (MULTIVITAMIN WITH MINERALS) TABS tablet Take 1 tablet by mouth daily.    . potassium chloride  (K-DUR,KLOR-CON) 10 MEQ tablet Take 1 tablet (10 mEq total) by mouth daily. 90 tablet 3  . ranitidine (ZANTAC) 150 MG tablet Take 1 tablet (150 mg total) by mouth 2 (two) times daily. 180 tablet 3  . risperiDONE (RISPERDAL) 1 MG tablet Take 1 tablet (1 mg total) by mouth at bedtime. 60 tablet 0  . nitroGLYCERIN (NITROSTAT) 0.4 MG SL tablet Place 1 tablet (0.4 mg total) under the tongue every 5 (five) minutes as needed for chest pain (max 3 in 15 minutes). (Patient not taking: Reported on 03/09/2015) 30 tablet 12   No current facility-administered medications for this visit.    Objective: BP 136/70 mmHg  Pulse 89  Temp(Src) 98.5 F (36.9 C)  Wt 198 lb (89.812 kg) Gen: NAD, resting comfortably in chair, pleasant CV: RRR no murmurs rubs or gallops Lungs: CTAB no crackles, wheeze, rhonchi Abdomen: soft/nontender/nondistended/normal bowel sounds. No rebound or guarding.  Ext: no edema Skin: warm, dry, no rash Neuro: grossly normal, moves all extremities  Assessment/Plan:  Schizophrenia S: compliant with risperdal 1 mg and remains without hallucinations A/P: has not yet called psychiatry- who I had advised for longterm care. Daughter wants patient to call- really wants mother to be more motivated. We opted to tighten follow up to 6 weeks and encourage follow up with psych within that time but I did refill for 6 weeks  Hypothyroidism S: Lab Results  Component Value Date   TSH  22.72* 03/09/2015   On thyroid medication-increasedt o 88 mcg last visit, but patient has only been alternating 59mcg and 50 mcg instead of taking 88 mcg ROS-No hair or nail changes. No heat/cold intolerance. No constipation or diarrhea. Denies shakiness or anxiety.  A/P: update TSH in 6 weeks and instructed patien tot take 88 mcg daily  Essential hypertension S: controlled on repeat on  metoproll 50mg  BID, lasix 40mg , imdur  BP Readings from Last 3 Encounters:  06/22/15 136/70  03/09/15 138/72  12/07/14 140/76   A/P:Continue current meds:  Doing well  Chronic systolic congestive heart failure, NYHA class 4 S: Weight is trending up but it is difficult to say if this is fluid related or poor food choices related and inactivity. Daughter states mother lays around most of the day. She does get short of breath with minimal activity but no recent change. No worsening edema but weight now up 9 lbs since October. Lungs are clear as well. Compliant with lasix 40mg  A/P: will watch weight trend closely- strongly encouraged more activity and healthier eating    Return in about 6 weeks (around 08/03/2015). Return precautions advised.    Meds ordered this encounter  Medications  . ranitidine (ZANTAC) 150 MG tablet    Sig: Take 1 tablet (150 mg total) by mouth 2 (two) times daily.    Dispense:  180 tablet    Refill:  3  . risperiDONE (RISPERDAL) 1 MG tablet    Sig: Take 1 tablet (1 mg total) by mouth at bedtime.    Dispense:  60 tablet    Refill:  0    Garret Reddish, MD

## 2015-06-23 NOTE — Assessment & Plan Note (Signed)
S: Lab Results  Component Value Date   TSH 22.72* 03/09/2015   On thyroid medication-increasedt o 88 mcg last visit, but patient has only been alternating 20mcg and 50 mcg instead of taking 88 mcg ROS-No hair or nail changes. No heat/cold intolerance. No constipation or diarrhea. Denies shakiness or anxiety.  A/P: update TSH in 6 weeks and instructed patien tot take 88 mcg daily

## 2015-06-23 NOTE — Assessment & Plan Note (Signed)
S: controlled on repeat on  metoproll 50mg  BID, lasix 40mg , imdur  BP Readings from Last 3 Encounters:  06/22/15 136/70  03/09/15 138/72  12/07/14 140/76  A/P:Continue current meds:  Doing well

## 2015-06-23 NOTE — Assessment & Plan Note (Signed)
S: compliant with risperdal 1 mg and remains without hallucinations A/P: has not yet called psychiatry- who I had advised for longterm care. Daughter wants patient to call- really wants mother to be more motivated. We opted to tighten follow up to 6 weeks and encourage follow up with psych within that time but I did refill for 6 weeks

## 2015-06-23 NOTE — Assessment & Plan Note (Signed)
S: Weight is trending up but it is difficult to say if this is fluid related or poor food choices related and inactivity. Daughter states mother lays around most of the day. She does get short of breath with minimal activity but no recent change. No worsening edema but weight now up 9 lbs since October. Lungs are clear as well. Compliant with lasix 40mg  A/P: will watch weight trend closely- strongly encouraged more activity and healthier eating

## 2015-07-03 ENCOUNTER — Encounter: Payer: Self-pay | Admitting: Hematology

## 2015-07-03 ENCOUNTER — Ambulatory Visit (HOSPITAL_BASED_OUTPATIENT_CLINIC_OR_DEPARTMENT_OTHER): Payer: Medicare Other | Admitting: Hematology

## 2015-07-03 ENCOUNTER — Other Ambulatory Visit: Payer: Self-pay | Admitting: Hematology

## 2015-07-03 ENCOUNTER — Other Ambulatory Visit: Payer: Medicare Other

## 2015-07-03 ENCOUNTER — Ambulatory Visit: Payer: Medicare Other | Admitting: Hematology

## 2015-07-03 ENCOUNTER — Other Ambulatory Visit (HOSPITAL_BASED_OUTPATIENT_CLINIC_OR_DEPARTMENT_OTHER): Payer: Medicare Other

## 2015-07-03 ENCOUNTER — Telehealth: Payer: Self-pay | Admitting: Hematology

## 2015-07-03 VITALS — BP 131/66 | HR 74 | Temp 98.1°F | Resp 16 | Wt 190.3 lb

## 2015-07-03 DIAGNOSIS — M8580 Other specified disorders of bone density and structure, unspecified site: Secondary | ICD-10-CM | POA: Diagnosis not present

## 2015-07-03 DIAGNOSIS — Z853 Personal history of malignant neoplasm of breast: Secondary | ICD-10-CM

## 2015-07-03 DIAGNOSIS — Z1231 Encounter for screening mammogram for malignant neoplasm of breast: Secondary | ICD-10-CM

## 2015-07-03 LAB — CBC WITH DIFFERENTIAL/PLATELET
BASO%: 1.2 % (ref 0.0–2.0)
Basophils Absolute: 0.1 10*3/uL (ref 0.0–0.1)
EOS ABS: 0.1 10*3/uL (ref 0.0–0.5)
EOS%: 1.3 % (ref 0.0–7.0)
HCT: 38.4 % (ref 34.8–46.6)
HGB: 12.7 g/dL (ref 11.6–15.9)
LYMPH%: 29.1 % (ref 14.0–49.7)
MCH: 27.4 pg (ref 25.1–34.0)
MCHC: 32.9 g/dL (ref 31.5–36.0)
MCV: 83.2 fL (ref 79.5–101.0)
MONO#: 0.5 10*3/uL (ref 0.1–0.9)
MONO%: 4.7 % (ref 0.0–14.0)
NEUT%: 63.7 % (ref 38.4–76.8)
NEUTROS ABS: 6.6 10*3/uL — AB (ref 1.5–6.5)
Platelets: 216 10*3/uL (ref 145–400)
RBC: 4.62 10*6/uL (ref 3.70–5.45)
RDW: 15.1 % — ABNORMAL HIGH (ref 11.2–14.5)
WBC: 10.4 10*3/uL — AB (ref 3.9–10.3)
lymph#: 3 10*3/uL (ref 0.9–3.3)

## 2015-07-03 LAB — COMPREHENSIVE METABOLIC PANEL
ALK PHOS: 118 U/L (ref 40–150)
ALT: 31 U/L (ref 0–55)
AST: 27 U/L (ref 5–34)
Albumin: 3.6 g/dL (ref 3.5–5.0)
Anion Gap: 9 mEq/L (ref 3–11)
BILIRUBIN TOTAL: 0.37 mg/dL (ref 0.20–1.20)
BUN: 8.8 mg/dL (ref 7.0–26.0)
CO2: 30 meq/L — AB (ref 22–29)
Calcium: 9.3 mg/dL (ref 8.4–10.4)
Chloride: 103 mEq/L (ref 98–109)
Creatinine: 1 mg/dL (ref 0.6–1.1)
EGFR: 63 mL/min/{1.73_m2} — AB (ref >90)
GLUCOSE: 166 mg/dL — AB (ref 70–140)
POTASSIUM: 3.3 meq/L — AB (ref 3.5–5.1)
SODIUM: 142 meq/L (ref 136–145)
Total Protein: 7.3 g/dL (ref 6.4–8.3)

## 2015-07-03 NOTE — Telephone Encounter (Signed)
Gave and pritned appt sched and avs for pt for may 2018

## 2015-07-03 NOTE — Progress Notes (Signed)
[facility]  [phone] [phone]  Clinic follow Up Note   Patient Care Team: Alicia Olp, MD as PCP - General (Family Medicine) 07/03/2015  CHIEF COMPLAINTS:  Follow Up breast and throat cancer  HISTORY OF PRESENTING ILLNESS (07/03/2015):  Alicia [name] 70 y.o. female is here because of her personal history of breast and throat cancer. She was recently relocated from [facility] to [facility] to stay with her daughter. She was referred to establish her oncological care with [place]. She presents with her daughter.  According to patient, she was diagnosed with right breast cancer in 2003, had neoadjuvant chemo (TCx4), follow by lumpectomy, and radiation afterwards. She then started Anastrozole, and has been on it since February 2016. She denies any history of cancer recurrence. Unfortunately her prior oncology medical records was not available during her visit. She has last mammogram in 2014 in [place], she is schedule to have one on /11/2014.   She also had throat cancer about 10 years ago, she had surgeries twice, and radiation, no chemo. She has been disease-free since then.  She feels well overall, no pain, cough, dyspnea or GI symptoms. No weight loss. She has good appetite and energy level.    She has history of schizophrenia, has been out of medication Risperdal for a few months. Per patient's daughter, she does have some hallucination, but mood is stable.  CURRENT THERAPY: Observation  INTERIM HISTORY: Alicia [name] returns for follow up. I saw her one year ago. She has been doing well since her last visit, denies ED visit or hospitalizations in the last 1 year. She feels well overall, denies any significant pain, or other new symptoms. She gained about 5 lbs in the past year.   MEDICAL HISTORY:  Past Medical History  Diagnosis Date  . CHF (congestive heart failure) ([place])   . Thyroid disease   . Hypertension   .  Diabetes mellitus without complication ([place])   . Cancer ([place])   . Heart disease   . Asthma     childhood  . Heart murmur     echo without obvious valvular issues  . Hyperlipidemia   . Kidney stone   . CAD (coronary artery disease)   . Paresthesias     radiculopaty L reported. R radiculopathy reporetd anothe rtime. Improved wt. loss and exercise. MRI lumbar scanned in.     SURGICAL HISTORY: Past Surgical History  Procedure Laterality Date  . Knee surgery      knee replacement L  . Thyroid surgery      surgery x2, adioactive iodine afte rreportedly 2006  . Lung stapled to ribs      pneumothorax x 2  . Breast surgery    . Lymphnodes Right     axillary  . Ectopic pregnancy surgery      SOCIAL HISTORY: Social History   Social History  . Marital Status: Single    Spouse Name: N/A  . Number of Children: N/A  . Years of Education: N/A   Occupational History  . Not on file.   Social History Main Topics  . Smoking status: Never Smoker   . Smokeless tobacco: Not on file  . Alcohol Use: No  . Drug Use: No  . Sexual Activity: Not on file   Other Topics Concern  . 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 Hyperlipidemia Mother   . Heart attack Mother 64  . Heart attack Father 28  . Stroke Father 92  . High blood pressure Father   . Cancer Sister 64    ovarian cancer   . Cancer Maternal Grandfather     gastric cancer     ALLERGIES:  is allergic to lisinopril.  MEDICATIONS:  Current Outpatient Prescriptions  Medication Sig Dispense Refill  . atorvastatin (LIPITOR) 20  MG tablet Take 1 tablet (20 mg total) by mouth daily. 90 tablet 3  . calcium-vitamin D (OSCAL WITH D) 500-200 MG-UNIT per tablet Take 2 tablets by mouth every evening.    . furosemide (LASIX) 40 MG tablet Take 1 tablet (40 mg total) by mouth daily. 90 tablet 3  . isosorbide mononitrate (IMDUR) 30 MG 24 hr tablet Take 1 tablet (30 mg total) by mouth daily. 90 tablet 3  . levothyroxine (SYNTHROID, LEVOTHROID) 88 MCG tablet Take 1 tablet (88 mcg total) by mouth daily. 90 tablet 3  . metoprolol (LOPRESSOR) 50 MG tablet Take 1 tablet (50 mg total) by mouth 2 (two) times daily. 180 tablet 3  . Multiple Vitamin (MULTIVITAMIN WITH MINERALS) TABS tablet Take 1 tablet by mouth daily.    . nitroGLYCERIN (NITROSTAT) 0.4 MG SL tablet Place 1 tablet (0.4 mg total) under the tongue every 5 (five) minutes as needed for chest pain (max 3 in 15 minutes). (Patient not taking: Reported on 03/09/2015) 30 tablet 12  . potassium chloride (K-DUR,KLOR-CON) 10 MEQ tablet Take 1 tablet (10 mEq total) by mouth daily. 90 tablet 3  . ranitidine (ZANTAC) 150 MG tablet Take 1 tablet (150 mg total) by mouth 2 (two) times daily. 180 tablet 3  . risperiDONE (RISPERDAL) 1 MG tablet Take 1 tablet (1 mg total) by mouth at bedtime. 60 tablet 0   No current facility-administered medications for this visit.    REVIEW OF SYSTEMS:   Constitutional: Denies fevers, chills or abnormal night sweats Eyes: Denies blurriness of vision, double vision or watery eyes Ears, nose, mouth, throat, and face: Denies mucositis or sore throat Respiratory: Denies cough, dyspnea or wheezes Cardiovascular: Denies palpitation, chest discomfort or lower extremity swelling Gastrointestinal:  Denies nausea, heartburn or change in bowel habits Skin: Denies abnormal skin rashes Lymphatics: Denies new lymphadenopathy or easy bruising Neurological:Denies numbness, tingling or new weaknesses Behavioral/Psych: Mood is stable, no new changes  All other systems were  reviewed with the patient and are negative.  PHYSICAL EXAMINATION: ECOG PERFORMANCE STATUS: 0 - Asymptomatic  Filed Vitals:   07/03/15 1142  BP: 131/66  Pulse: 74  Temp: 98.1 F (36.7 C)  Resp: 16   Filed Weights   07/03/15 1142  Weight: 190 lb 4.8 oz (86.32 kg)    GENERAL:[name], no distress and comfortable SKIN: skin color, texture, turgor are normal, no rashes or significant lesions EYES: normal, conjunctiva are pink and non-injected, sclera clear OROPHARYNX:no exudate, no erythema and lips, buccal mucosa, and tongue normal  NECK: supple, thyroid normal size, non-tender, without nodularity LYMPH:  no palpable lymphadenopathy in the cervical, axillary or inguinal LUNGS: clear to auscultation and percussion with normal breathing  effort HEART: regular rate & rhythm and no murmurs and no lower extremity edema ABDOMEN:abdomen soft, non-tender and normal bowel sounds Musculoskeletal:no cyanosis of digits and no clubbing  PSYCH: [name] & oriented x 3 with fluent speech NEURO: no focal motor/sensory deficits Breasts: Breast inspection showed them to be symmetrical with no nipple discharge. Palpation of the breasts and axilla revealed no obvious mass that I could appreciate. The right breast and axillary surgical scar well healed.   LABORATORY DATA:  I have reviewed the data as listed CBC Latest Ref Rng 07/03/2015 03/09/2015 05/29/2014  WBC 3.9 - 10.3 10e3/uL 10.4(H) 8.6 7.7  Hemoglobin 11.6 - 15.9 g/dL 12.7 14.4 14.5  Hematocrit 34.8 - 46.6 % 38.4 43.9 43.5  Platelets 145 - 400 10e3/uL 216 280.0 315.0    CMP Latest Ref Rng 07/03/2015 03/09/2015 08/31/2014  Glucose 70 - 140 mg/dl 166(H) 128(H) 99  BUN 7.0 - 26.0 mg/dL 8.8 8 9   Creatinine 0.6 - 1.1 mg/dL 1.0 1.01 0.92  Sodium 136 - 145 mEq/L 142 141 141  Potassium 3.5 - 5.1 mEq/L 3.3(L) 3.5 4.3  Chloride 96 - 112 mEq/L - 101 104  CO2 22 - 29 mEq/L 30(H) 30 25  Calcium 8.4 - 10.4 mg/dL 9.3 9.3 9.4  Total Protein 6.4 - 8.3 g/dL 7.3 -  -  Total Bilirubin 0.20 - 1.20 mg/dL 0.37 - -  Alkaline Phos 40 - 150 U/L 118 - -  AST 5 - 34 U/L 27 - -  ALT 0 - 55 U/L 31 - -     RADIOGRAPHIC STUDIES: I have personally reviewed the radiological images as listed and agreed with the findings in the report.  Digital screening bilateral mammogram 07/24/2014 IMPRESSION: No mammographic evidence of malignancy. A result letter of this screening mammogram will be mailed directly to the patient.  RECOMMENDATION: Screening mammogram in one year. (Code:SM-B-01Y)  ASSESSMENT & PLAN:  70 year old African-American female, with history of schizophrenia, coronary artery disease, hypertension, CHF, diabetes, hypothyroidism, and a personal history of breast cancer and throat cancer.   1. History of right Breast cancer in 2003 -Unfortunately I do not have her outside medical records during her initial visit. The patient she had node-positive right breast cancer, status post lumpectomy, chemotherapy and radiation. It's been about 14 years since her initial diagnosis, I do not see a role of prolonged adjuvant antiestrogen therapy. So i stopped her anastrozole in 06/2014.  -She is clinically doing well, lab results reviewed with her, physical exam was unremarkable, her mammogram in 2016 was negative, no evidence of recurrence. -We'll continue breast cancer e surveillance plan with annual screening mammogram, self exam and routine follow-up with [place]. -I encouraged her to have healthy diet, and the aggressive size. Also take calcium and vitamin D for bone health.  2. History of throat cancer -Her medical history is not again not available. We'll continue surveillance.  3. Osteopenia -Her last bone density scan in November 2016 showed T score -1.4 at the left femur neck. The estimated 10 year risk of major osteoporotic fracture 4%, hip fracture risk 0.5%, -She'll continue the calcium and vitamin D.  4. She will continue follow-up with her primary care  physician for other medical issues.   Follow-up -I'll see her back in one year with labs and exam -She will have mammograms in 12/2015.  All questions were answered. The patient knows to call the clinic with any problems, questions or concerns. I spent 20 minutes counseling the patient face to face. The  total time spent in the appointment was 25 minutes and more than 50% was on counseling.     Truitt Merle, MD 07/03/2015

## 2015-07-27 ENCOUNTER — Ambulatory Visit: Payer: Medicare Other | Admitting: Family Medicine

## 2015-07-30 ENCOUNTER — Ambulatory Visit (INDEPENDENT_AMBULATORY_CARE_PROVIDER_SITE_OTHER): Payer: Medicare Other | Admitting: Family Medicine

## 2015-07-30 ENCOUNTER — Ambulatory Visit
Admission: RE | Admit: 2015-07-30 | Discharge: 2015-07-30 | Disposition: A | Discharge status: Home / Self Care | Payer: Medicare Other | Source: Ambulatory Visit | Attending: Hematology | Admitting: Hematology

## 2015-07-30 ENCOUNTER — Encounter: Payer: Self-pay | Admitting: Family Medicine

## 2015-07-30 VITALS — BP 136/86 | HR 72 | Temp 98.8°F | Ht 61.0 in | Wt 188.0 lb

## 2015-07-30 DIAGNOSIS — Z1231 Encounter for screening mammogram for malignant neoplasm of breast: Secondary | ICD-10-CM

## 2015-07-30 DIAGNOSIS — I10 Essential (primary) hypertension: Secondary | ICD-10-CM

## 2015-07-30 DIAGNOSIS — E038 Other specified hypothyroidism: Secondary | ICD-10-CM | POA: Diagnosis not present

## 2015-07-30 DIAGNOSIS — F209 Schizophrenia, unspecified: Secondary | ICD-10-CM | POA: Diagnosis not present

## 2015-07-30 DIAGNOSIS — E785 Hyperlipidemia, unspecified: Secondary | ICD-10-CM | POA: Diagnosis not present

## 2015-07-30 DIAGNOSIS — Z853 Personal history of malignant neoplasm of breast: Secondary | ICD-10-CM

## 2015-07-30 MED ORDER — ISOSORBIDE MONONITRATE ER 30 MG PO TB24: 30.0000 mg | ORAL_TABLET | Freq: Every day | ORAL | Status: DC

## 2015-07-30 NOTE — Assessment & Plan Note (Signed)
S: instructed patient to take 88 mcg daily after last visit instead of alternating with 50 mcg- has been compliant, suspect improved control A/P: update tsh today

## 2015-07-30 NOTE — Assessment & Plan Note (Signed)
S: risperdal 1mg  without hallucinations. We have been prescribing while strongly encouraging psychiatry follow up. She has hallucinations off meds- baseline history unclear but daughter had stated schizophrenia though records were unclear A/P: psychiatry visit on Thursday, encouraged continued use for now of medication and we would get psychiatry's opinion and hopeful assumption of medication rx

## 2015-07-30 NOTE — Progress Notes (Signed)
Subjective:  Alicia [name] is a 70 y.o. year old very pleasant female patient who presents for/with See problem oriented charting ROS- no chest pain, shortness of breath, headache, blurry vision. Does have very low motivation to do any activities including even going on short walks..see any ROS included in HPI as well.   Past Medical History-  Patient Active Problem List   Diagnosis Date Noted  . Schizophrenia ([place]) 05/29/2014    Priority: High  . Ischemic heart disease 12/05/2013    Priority: High  . Chronic systolic congestive heart failure, NYHA class 4 ([place]) 12/05/2013    Priority: High  . Hyperlipidemia 05/29/2014    Priority: Medium  . Essential hypertension 12/05/2013    Priority: Medium  . Hypothyroidism 12/05/2013    Priority: Medium  . History of breast cancer in female 12/05/2013    Priority: Medium  . Hyperglycemia 12/05/2013    Priority: Medium  . History of knee replacement 07/26/2014    Priority: Low  . GERD (gastroesophageal reflux disease) 12/05/2013    Priority: Low  . Osteopenia 12/20/2014  . History of colonic polyps 07/26/2014  . History of throat cancer 07/03/2014  . Heart murmur 12/05/2013    Medications- reviewed and updated Current Outpatient Prescriptions  Medication Sig Dispense Refill  . atorvastatin (LIPITOR) 20 MG tablet Take 1 tablet (20 mg total) by mouth daily. 90 tablet 3  . calcium-vitamin D (OSCAL WITH D) 500-200 MG-UNIT per tablet Take 2 tablets by mouth every evening.    . furosemide (LASIX) 40 MG tablet Take 1 tablet (40 mg total) by mouth daily. 90 tablet 3  . isosorbide mononitrate (IMDUR) 30 MG 24 hr tablet Take 1 tablet (30 mg total) by mouth daily. 90 tablet 3  . levothyroxine (SYNTHROID, LEVOTHROID) 88 MCG tablet Take 1 tablet (88 mcg total) by mouth daily. 90 tablet 3  . metoprolol (LOPRESSOR) 50 MG tablet Take 1 tablet (50 mg total) by mouth 2 (two) times daily. 180 tablet 3  . Multiple Vitamin (MULTIVITAMIN WITH MINERALS) TABS  tablet Take 1 tablet by mouth daily.    . nitroGLYCERIN (NITROSTAT) 0.4 MG SL tablet Place 1 tablet (0.4 mg total) under the tongue every 5 (five) minutes as needed for chest pain (max 3 in 15 minutes). 30 tablet 12  . potassium chloride (K-DUR,KLOR-CON) 10 MEQ tablet Take 1 tablet (10 mEq total) by mouth daily. 90 tablet 3  . ranitidine (ZANTAC) 150 MG tablet Take 1 tablet (150 mg total) by mouth 2 (two) times daily. 180 tablet 3  . risperiDONE (RISPERDAL) 1 MG tablet Take 1 tablet (1 mg total) by mouth at bedtime. 60 tablet 0   No current facility-administered medications for this visit.    Objective: BP 136/86 mmHg  Pulse 72  Temp(Src) 98.8 F (37.1 C) (Oral)  Ht 5\' 1"  (1.549 m)  Wt 188 lb (85.276 kg)  BMI 35.54 kg/m2 Gen: NAD, resting comfortably CV: RRR no murmurs rubs or gallops Lungs: CTAB no crackles, wheeze, rhonchi Abdomen: soft/nontender/nondistended/normal bowel sounds.  Ext: no edema Skin: warm, dry Neuro: grossly normal, moves all extremities  Assessment/Plan:    Schizophrenia S: risperdal 1mg  without hallucinations. We have been prescribing while strongly encouraging psychiatry follow up. She has hallucinations off meds- baseline history unclear but daughter had stated schizophrenia though records were unclear A/P: psychiatry visit on Thursday, encouraged continued use for now of medication and we would get psychiatry's opinion and hopeful assumption of medication rx  Hypothyroidism S: instructed patient to take  88 mcg daily after last visit instead of alternating with 50 mcg- has been compliant, suspect improved control A/P: update tsh today  Essential hypertension S: controlled.  On  Metoprolol 50mg  BID, lasix 40mg , imdur  BP Readings from Last 3 Encounters:  07/30/15 136/86  07/03/15 131/66  06/22/15 136/70  A/P:Continue current meds:  Refilled imdur   3 months- may space to 4 months at that point  Orders Placed This Encounter  Procedures  . TSH     [place]  . LDL cholesterol, direct    [place]    Meds ordered this encounter  Medications  . isosorbide mononitrate (IMDUR) 30 MG 24 hr tablet    Sig: Take 1 tablet (30 mg total) by mouth daily.    Dispense:  90 tablet    Refill:  3    Return precautions advised.  Garret Reddish, MD

## 2015-07-30 NOTE — Assessment & Plan Note (Signed)
S: controlled.  On  Metoprolol 50mg  BID, lasix 40mg , imdur  BP Readings from Last 3 Encounters:  07/30/15 136/86  07/03/15 131/66  06/22/15 136/70  A/P:Continue current meds:  Refilled imdur

## 2015-07-30 NOTE — Patient Instructions (Addendum)
Check labs before you leave  May have to adjust thyroid medicine- if so would be 6-8 weeks for repeat testing.   No other changes otherwise, thrilled you are seeing psychiatry soon

## 2015-07-31 ENCOUNTER — Telehealth: Payer: Self-pay

## 2015-07-31 LAB — TSH: TSH: 2.77 u[IU]/mL (ref 0.35–4.50)

## 2015-07-31 LAB — LDL CHOLESTEROL, DIRECT: Direct LDL: 55 mg/dL

## 2015-07-31 NOTE — Telephone Encounter (Signed)
No answer. Will try later

## 2015-07-31 NOTE — Telephone Encounter (Signed)
-----   Message from Marin Olp, MD sent at 07/31/2015 12:03 PM EDT ----- Thyroid back in normal range- continue current dose. CHolesterol at goal with LDL <70

## 2015-08-02 NOTE — Telephone Encounter (Signed)
Called patient.  No answer.

## 2015-08-07 NOTE — Telephone Encounter (Signed)
Spoke to patient. Gave results. Patient verbalized understanding. 

## 2015-08-23 ENCOUNTER — Other Ambulatory Visit: Payer: Self-pay | Admitting: Hematology

## 2015-08-23 DIAGNOSIS — R928 Other abnormal and inconclusive findings on diagnostic imaging of breast: Secondary | ICD-10-CM

## 2015-09-19 ENCOUNTER — Other Ambulatory Visit: Payer: Medicare Other

## 2015-09-25 ENCOUNTER — Ambulatory Visit
Admission: RE | Admit: 2015-09-25 | Discharge: 2015-09-25 | Disposition: A | Discharge status: Home / Self Care | Payer: Medicare Other | Source: Ambulatory Visit | Attending: Hematology | Admitting: Hematology

## 2015-09-25 DIAGNOSIS — R928 Other abnormal and inconclusive findings on diagnostic imaging of breast: Secondary | ICD-10-CM

## 2015-09-25 DIAGNOSIS — N63 Unspecified lump in breast: Secondary | ICD-10-CM | POA: Diagnosis not present

## 2015-10-08 ENCOUNTER — Ambulatory Visit (INDEPENDENT_AMBULATORY_CARE_PROVIDER_SITE_OTHER): Payer: Medicare Other | Admitting: Family Medicine

## 2015-10-08 ENCOUNTER — Encounter: Payer: Self-pay | Admitting: Family Medicine

## 2015-10-08 VITALS — BP 138/88 | HR 86 | Temp 98.6°F | Wt 180.6 lb

## 2015-10-08 DIAGNOSIS — F209 Schizophrenia, unspecified: Secondary | ICD-10-CM | POA: Diagnosis not present

## 2015-10-08 DIAGNOSIS — E7800 Pure hypercholesterolemia, unspecified: Secondary | ICD-10-CM | POA: Diagnosis not present

## 2015-10-08 DIAGNOSIS — I5022 Chronic systolic (congestive) heart failure: Secondary | ICD-10-CM

## 2015-10-08 DIAGNOSIS — T148 Other injury of unspecified body region: Secondary | ICD-10-CM | POA: Diagnosis not present

## 2015-10-08 DIAGNOSIS — I259 Chronic ischemic heart disease, unspecified: Secondary | ICD-10-CM | POA: Diagnosis not present

## 2015-10-08 DIAGNOSIS — W57XXXA Bitten or stung by nonvenomous insect and other nonvenomous arthropods, initial encounter: Secondary | ICD-10-CM

## 2015-10-08 MED ORDER — LEVOTHYROXINE SODIUM 88 MCG PO TABS: 88.0000 ug | ORAL_TABLET | Freq: Every day | ORAL | 1 refills | Status: AC

## 2015-10-08 MED ORDER — RANITIDINE HCL 150 MG PO TABS: 150.0000 mg | ORAL_TABLET | Freq: Two times a day (BID) | ORAL | 1 refills | Status: AC

## 2015-10-08 MED ORDER — FUROSEMIDE 40 MG PO TABS: 40.0000 mg | ORAL_TABLET | Freq: Every day | ORAL | 1 refills | Status: AC

## 2015-10-08 MED ORDER — ISOSORBIDE MONONITRATE ER 30 MG PO TB24: 30.0000 mg | ORAL_TABLET | Freq: Every day | ORAL | 1 refills | Status: AC

## 2015-10-08 MED ORDER — ATORVASTATIN CALCIUM 20 MG PO TABS: 20.0000 mg | ORAL_TABLET | Freq: Every day | ORAL | 1 refills | Status: AC

## 2015-10-08 MED ORDER — NITROGLYCERIN 0.4 MG SL SUBL: 0.4000 mg | SUBLINGUAL_TABLET | SUBLINGUAL | 3 refills | Status: AC | PRN

## 2015-10-08 MED ORDER — METOPROLOL TARTRATE 50 MG PO TABS: 50.0000 mg | ORAL_TABLET | Freq: Two times a day (BID) | ORAL | 1 refills | Status: AC

## 2015-10-08 NOTE — Patient Instructions (Signed)
Refilled meds for 6 months.   I will truly miss you and your daughter  Bumps appear to be moderate local reactions. Should resolve within a week. If these get worse, have increasing redness, you have fevers, worse pain- return to see [place]

## 2015-10-08 NOTE — Progress Notes (Signed)
Pre visit review using our clinic review tool, if applicable. No additional management support is needed unless otherwise documented below in the visit note. 

## 2015-10-08 NOTE — Progress Notes (Signed)
Subjective:  Alicia [name] is a 70 y.o. year old very pleasant female patient who presents for/with See problem oriented charting ROS- No chest pain or shortness of breath. No headache or blurry vision. Some difficulty sleeping.see any ROS included in HPI as well.   Past Medical History-  Patient Active Problem List   Diagnosis Date Noted  . Schizophrenia ([place]) 05/29/2014    Priority: High  . Ischemic heart disease 12/05/2013    Priority: High  . Chronic systolic congestive heart failure, NYHA class 4 ([place]) 12/05/2013    Priority: High  . Hyperlipidemia 05/29/2014    Priority: Medium  . Essential hypertension 12/05/2013    Priority: Medium  . Hypothyroidism 12/05/2013    Priority: Medium  . History of breast cancer in female 12/05/2013    Priority: Medium  . Hyperglycemia 12/05/2013    Priority: Medium  . History of knee replacement 07/26/2014    Priority: Low  . GERD (gastroesophageal reflux disease) 12/05/2013    Priority: Low  . Osteopenia 12/20/2014  . History of colonic polyps 07/26/2014  . History of throat cancer 07/03/2014  . Heart murmur 12/05/2013    Medications- reviewed and updated Current Outpatient Prescriptions  Medication Sig Dispense Refill  . atorvastatin (LIPITOR) 20 MG tablet Take 1 tablet (20 mg total) by mouth daily. 90 tablet 3  . calcium-vitamin D (OSCAL WITH D) 500-200 MG-UNIT per tablet Take 2 tablets by mouth every evening.    . furosemide (LASIX) 40 MG tablet Take 1 tablet (40 mg total) by mouth daily. 90 tablet 3  . isosorbide mononitrate (IMDUR) 30 MG 24 hr tablet Take 1 tablet (30 mg total) by mouth daily. 90 tablet 3  . levothyroxine (SYNTHROID, LEVOTHROID) 88 MCG tablet Take 1 tablet (88 mcg total) by mouth daily. 90 tablet 3  . metoprolol (LOPRESSOR) 50 MG tablet Take 1 tablet (50 mg total) by mouth 2 (two) times daily. 180 tablet 3  . Multiple Vitamin (MULTIVITAMIN WITH MINERALS) TABS tablet Take 1 tablet by mouth daily.    .  nitroGLYCERIN (NITROSTAT) 0.4 MG SL tablet Place 1 tablet (0.4 mg total) under the tongue every 5 (five) minutes as needed for chest pain (max 3 in 15 minutes). 30 tablet 12  . potassium chloride (K-DUR,KLOR-CON) 10 MEQ tablet Take 1 tablet (10 mEq total) by mouth daily. 90 tablet 3  . ranitidine (ZANTAC) 150 MG tablet Take 1 tablet (150 mg total) by mouth 2 (two) times daily. 180 tablet 3  . risperiDONE (RISPERDAL) 1 MG tablet Take 1 tablet (1 mg total) by mouth at bedtime. 60 tablet 0     Objective: BP 138/88   Pulse 86   Temp 98.6 F (37 C) (Oral)   Wt 180 lb 9.6 oz (81.9 kg)   SpO2 96%   BMI 34.12 kg/m  Gen: NAD, resting comfortably CV: RRR no murmurs rubs or gallops Lungs: CTAB no crackles, wheeze, rhonchi Abdomen: soft/nontender/nondistended/normal bowel sounds.  Ext: no edema Skin: warm, dry Neuro: grossly normal, moves all extremities  Assessment/Plan: Insect bites S: Patient was out of state recently. Apparently there were some carpet beetles where she was staying. Unsure if she was bit by them or another insect. She has several red raised bumps on arms or legs- worst on upper left arm. They are itchy. Have been about the same since occurred- no worsening redness, expansion or pain ROS-not ill appearing, no fever/chills. No new medications. Not immunocompromised. No mucus membrane involvement.  A/P: evaluation of these lesions  reveals local reaction to instect bite. Discussed. may take time for these to heal. Can do topical therapy such as benadryl or short term hydrocortisone if desired or can just leave them alone. Return if worsening  Patient also informs me moving to [place] next month. Asks for 6 months of meds to hold her over to get a new PCP.   Schizophrenia S: history of hallucinations off risperdal though has been off for at least a month and no hallucinations. Just mild poor sleep. I had given several refills but patient understood she was supposed to  estabilish with psychiatry for further refills which she hasnt done A/P: encouraged psychiatry follow up in her transition  Systolic heart failure CAD S: controlled on lasix 40mg  daily, emtoprolol 40mg  BID. Also imdur controls angina. No use of nitroglycerin at present. Lisinopril allergy- cough intolerable A/P: continue current meds   Hypothyroidism- on levothyroxine 88 mcg daily   Hyperglycemia- a1c of 5.8 in past  History breast cancer and throat cancer- Records show "breast cancer s/p partial mastectomy 10/2005, s/p radiation therapy and chemo 2008 for stage IC invasive ductal CA" 2003 reported by patient. Arimedex stopped 04/2014 as ran out, refilled 05/29/14 and referred to oncology for long term dosing. Reasonable to establish with oncology and review onc notes from here as well  HTN- controlled on metoprolol, lasix, imdur though runs high normal mostly  History of knee replacement  GERD_ on zantac controlled  Osteopenia- LFN -1.4 worst t score  History of colon polyps from2012- never was able to track down pathology- may need updated colonoscopy when moves  No problem-specific Assessment & Plan notes found for this encounter.   No Follow-up on file.  No orders of the defined types were placed in this encounter.   No orders of the defined types were placed in this encounter.   Return precautions advised.  Garret Reddish, MD

## 2015-10-30 ENCOUNTER — Ambulatory Visit: Payer: Medicare Other | Admitting: Family Medicine

## 2015-12-06 DIAGNOSIS — E039 Hypothyroidism, unspecified: Secondary | ICD-10-CM | POA: Diagnosis not present

## 2015-12-06 DIAGNOSIS — Z853 Personal history of malignant neoplasm of breast: Secondary | ICD-10-CM | POA: Diagnosis not present

## 2015-12-06 DIAGNOSIS — F200 Paranoid schizophrenia: Secondary | ICD-10-CM | POA: Diagnosis not present

## 2015-12-06 DIAGNOSIS — E559 Vitamin D deficiency, unspecified: Secondary | ICD-10-CM | POA: Diagnosis not present

## 2015-12-06 DIAGNOSIS — R7303 Prediabetes: Secondary | ICD-10-CM | POA: Diagnosis not present

## 2015-12-06 DIAGNOSIS — I10 Essential (primary) hypertension: Secondary | ICD-10-CM | POA: Diagnosis not present

## 2015-12-06 DIAGNOSIS — I2510 Atherosclerotic heart disease of native coronary artery without angina pectoris: Secondary | ICD-10-CM | POA: Diagnosis not present

## 2015-12-06 DIAGNOSIS — E785 Hyperlipidemia, unspecified: Secondary | ICD-10-CM | POA: Diagnosis not present

## 2015-12-13 DIAGNOSIS — E559 Vitamin D deficiency, unspecified: Secondary | ICD-10-CM | POA: Diagnosis not present

## 2015-12-13 DIAGNOSIS — E876 Hypokalemia: Secondary | ICD-10-CM | POA: Diagnosis not present

## 2015-12-13 DIAGNOSIS — R8290 Unspecified abnormal findings in urine: Secondary | ICD-10-CM | POA: Diagnosis not present

## 2015-12-13 DIAGNOSIS — R3129 Other microscopic hematuria: Secondary | ICD-10-CM | POA: Diagnosis not present

## 2016-01-17 DIAGNOSIS — R079 Chest pain, unspecified: Secondary | ICD-10-CM | POA: Diagnosis not present

## 2016-01-17 DIAGNOSIS — I10 Essential (primary) hypertension: Secondary | ICD-10-CM | POA: Diagnosis not present

## 2016-01-17 DIAGNOSIS — E785 Hyperlipidemia, unspecified: Secondary | ICD-10-CM | POA: Diagnosis not present

## 2016-01-17 DIAGNOSIS — I2510 Atherosclerotic heart disease of native coronary artery without angina pectoris: Secondary | ICD-10-CM | POA: Diagnosis not present

## 2016-01-29 DIAGNOSIS — R079 Chest pain, unspecified: Secondary | ICD-10-CM | POA: Diagnosis not present

## 2016-01-29 DIAGNOSIS — I2510 Atherosclerotic heart disease of native coronary artery without angina pectoris: Secondary | ICD-10-CM | POA: Diagnosis not present

## 2016-03-14 DIAGNOSIS — I2510 Atherosclerotic heart disease of native coronary artery without angina pectoris: Secondary | ICD-10-CM | POA: Diagnosis not present

## 2016-03-14 DIAGNOSIS — I10 Essential (primary) hypertension: Secondary | ICD-10-CM | POA: Diagnosis not present

## 2016-03-14 DIAGNOSIS — E876 Hypokalemia: Secondary | ICD-10-CM | POA: Diagnosis not present

## 2016-03-14 DIAGNOSIS — E039 Hypothyroidism, unspecified: Secondary | ICD-10-CM | POA: Diagnosis not present

## 2016-03-17 DIAGNOSIS — I252 Old myocardial infarction: Secondary | ICD-10-CM | POA: Diagnosis not present

## 2016-03-17 DIAGNOSIS — R7303 Prediabetes: Secondary | ICD-10-CM | POA: Diagnosis not present

## 2016-03-17 DIAGNOSIS — E876 Hypokalemia: Secondary | ICD-10-CM | POA: Diagnosis not present

## 2016-03-17 DIAGNOSIS — E039 Hypothyroidism, unspecified: Secondary | ICD-10-CM | POA: Diagnosis not present

## 2016-03-17 DIAGNOSIS — E785 Hyperlipidemia, unspecified: Secondary | ICD-10-CM | POA: Diagnosis not present

## 2016-03-17 DIAGNOSIS — R3129 Other microscopic hematuria: Secondary | ICD-10-CM | POA: Diagnosis not present

## 2016-03-17 DIAGNOSIS — R8290 Unspecified abnormal findings in urine: Secondary | ICD-10-CM | POA: Diagnosis not present

## 2016-03-17 DIAGNOSIS — E559 Vitamin D deficiency, unspecified: Secondary | ICD-10-CM | POA: Diagnosis not present

## 2016-04-08 DIAGNOSIS — R748 Abnormal levels of other serum enzymes: Secondary | ICD-10-CM | POA: Diagnosis not present

## 2016-05-11 IMAGING — CR DG CHEST 2V
2 series · 2 of 2 positions shown · non-contrast
Comparison: None.

CLINICAL DATA: Nonsmoker. New onset mid chest pain. Discomfort
shortness of breath. History of CHF.

EXAM:
CHEST  2 VIEW

[w chest pa]
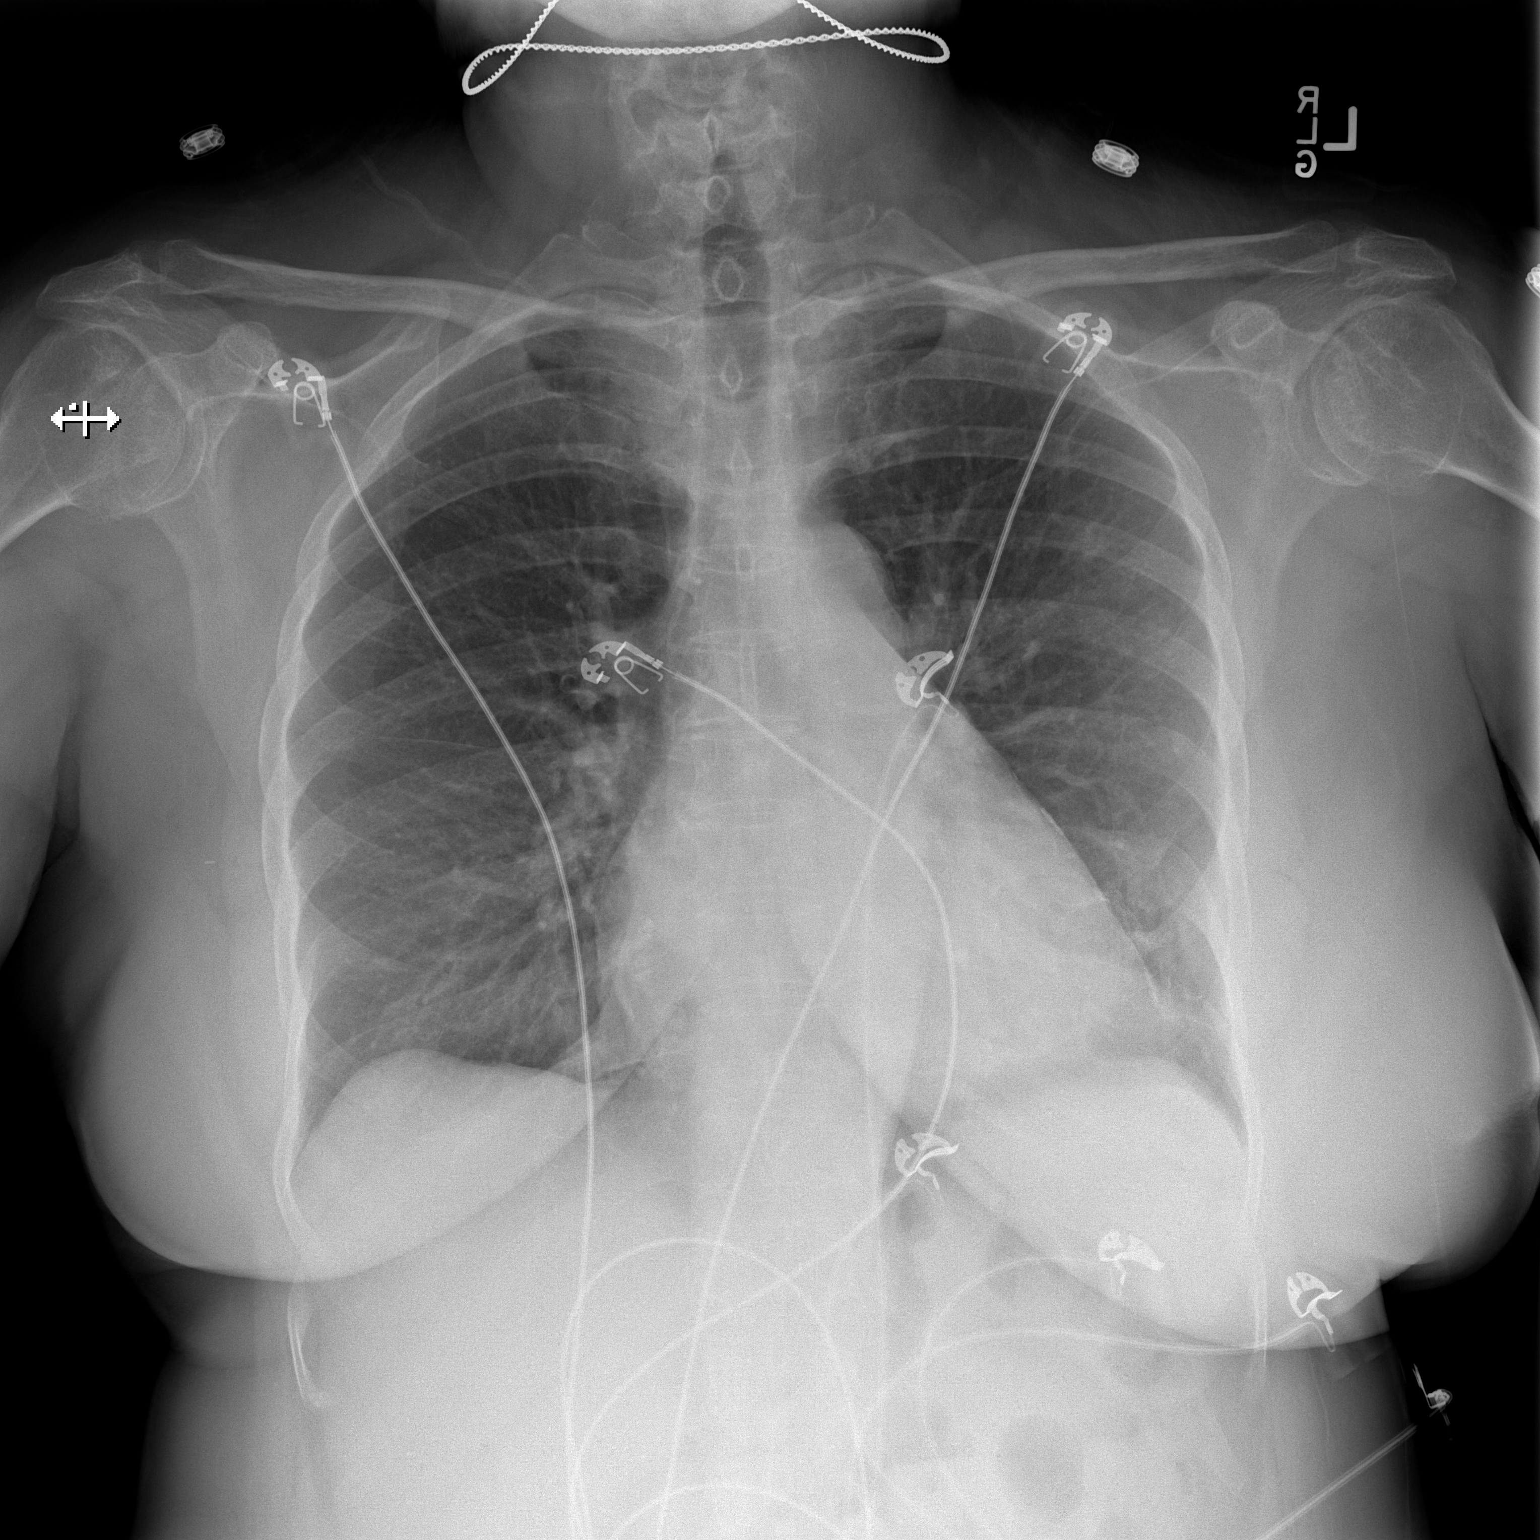

[w chest lat]
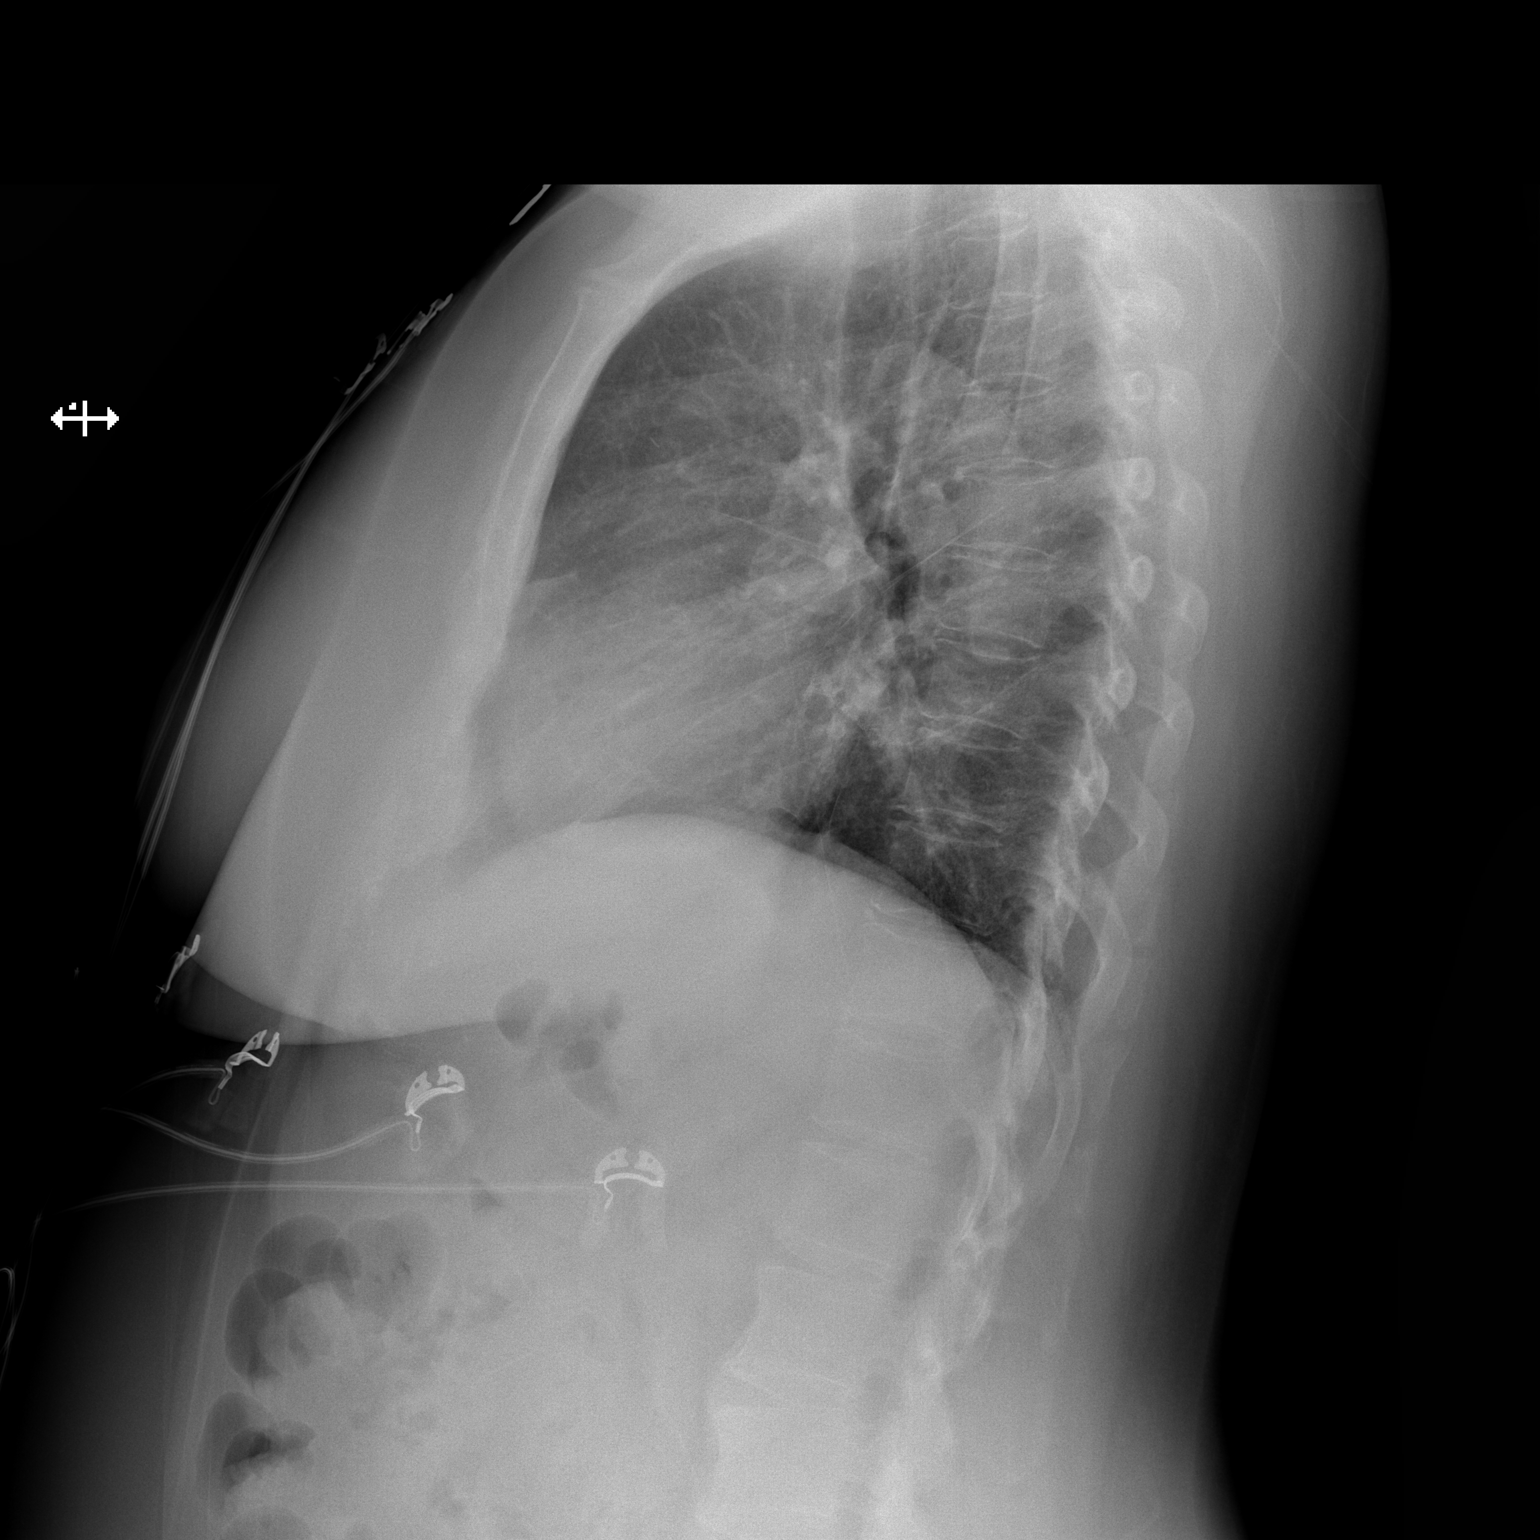

[2 of 2 positions shown; findings below may reference images not displayed]

FINDINGS: Heart size is normal. There is minimal pericardial or mediastinal
calcification. No focal consolidations or pleural effusions. No
pulmonary edema. Visualized osseous structures have a normal
appearance.
IMPRESSION: 1.  No evidence for acute cardiopulmonary abnormality.
2. Suspect pericardial calcifications.

## 2016-06-11 DIAGNOSIS — I10 Essential (primary) hypertension: Secondary | ICD-10-CM | POA: Diagnosis not present

## 2016-06-11 DIAGNOSIS — I2510 Atherosclerotic heart disease of native coronary artery without angina pectoris: Secondary | ICD-10-CM | POA: Diagnosis not present

## 2016-06-11 DIAGNOSIS — R221 Localized swelling, mass and lump, neck: Secondary | ICD-10-CM | POA: Diagnosis not present

## 2016-06-11 DIAGNOSIS — E039 Hypothyroidism, unspecified: Secondary | ICD-10-CM | POA: Diagnosis not present

## 2016-06-19 DIAGNOSIS — R590 Localized enlarged lymph nodes: Secondary | ICD-10-CM | POA: Diagnosis not present

## 2016-06-19 DIAGNOSIS — R221 Localized swelling, mass and lump, neck: Secondary | ICD-10-CM | POA: Diagnosis not present

## 2016-06-27 ENCOUNTER — Telehealth: Payer: Self-pay | Admitting: Hematology

## 2016-06-27 NOTE — Telephone Encounter (Signed)
Per 5/18 schedule message moved 5/22 lab/fu from PM to AM due to call. Left message for patient dtr. Also called secondary number listed but was not able to get through.

## 2016-06-30 DIAGNOSIS — I10 Essential (primary) hypertension: Secondary | ICD-10-CM | POA: Diagnosis not present

## 2016-06-30 DIAGNOSIS — R7303 Prediabetes: Secondary | ICD-10-CM | POA: Diagnosis not present

## 2016-06-30 DIAGNOSIS — I2510 Atherosclerotic heart disease of native coronary artery without angina pectoris: Secondary | ICD-10-CM | POA: Diagnosis not present

## 2016-06-30 DIAGNOSIS — F200 Paranoid schizophrenia: Secondary | ICD-10-CM | POA: Diagnosis not present

## 2016-07-01 ENCOUNTER — Ambulatory Visit: Payer: Medicare Other | Admitting: Hematology

## 2016-07-01 ENCOUNTER — Other Ambulatory Visit: Payer: Medicare Other

## 2016-09-24 DIAGNOSIS — E559 Vitamin D deficiency, unspecified: Secondary | ICD-10-CM | POA: Diagnosis not present

## 2016-09-24 DIAGNOSIS — I2510 Atherosclerotic heart disease of native coronary artery without angina pectoris: Secondary | ICD-10-CM | POA: Diagnosis not present

## 2016-09-24 DIAGNOSIS — I10 Essential (primary) hypertension: Secondary | ICD-10-CM | POA: Diagnosis not present

## 2016-09-24 DIAGNOSIS — E039 Hypothyroidism, unspecified: Secondary | ICD-10-CM | POA: Diagnosis not present

## 2016-09-24 DIAGNOSIS — E119 Type 2 diabetes mellitus without complications: Secondary | ICD-10-CM | POA: Diagnosis not present

## 2016-09-24 DIAGNOSIS — E785 Hyperlipidemia, unspecified: Secondary | ICD-10-CM | POA: Diagnosis not present

## 2016-09-29 DIAGNOSIS — E039 Hypothyroidism, unspecified: Secondary | ICD-10-CM | POA: Diagnosis not present

## 2016-09-29 DIAGNOSIS — I10 Essential (primary) hypertension: Secondary | ICD-10-CM | POA: Diagnosis not present

## 2016-09-29 DIAGNOSIS — I2510 Atherosclerotic heart disease of native coronary artery without angina pectoris: Secondary | ICD-10-CM | POA: Diagnosis not present

## 2016-09-29 DIAGNOSIS — E119 Type 2 diabetes mellitus without complications: Secondary | ICD-10-CM | POA: Diagnosis not present

## 2019-07-18 ENCOUNTER — Inpatient Hospital Stay: Payer: MEDICARE

## 2019-07-18 LAB — POCT GLUCOSE: Meter Glucose: 128 mg/dL — ABNORMAL HIGH (ref 74–99)

## 2019-07-18 MED ORDER — TROPICAMIDE 1 % OP SOLN
1 % | OPHTHALMIC | Status: DC
Start: 2019-07-18 — End: 2019-07-18
  Administered 2019-07-18: 14:00:00 1 [drp] via OPHTHALMIC

## 2019-07-18 MED ORDER — LIDOCAINE HCL (PF) 4 % IJ SOLN
4 % | INTRAMUSCULAR | Status: DC | PRN
Start: 2019-07-18 — End: 2019-07-18
  Administered 2019-07-18: 14:00:00 5

## 2019-07-18 MED ORDER — HYDROCODONE-ACETAMINOPHEN 5-325 MG PO TABS
5-325 MG | ORAL | Status: DC | PRN
Start: 2019-07-18 — End: 2019-07-18

## 2019-07-18 MED ORDER — FENTANYL CITRATE (PF) 100 MCG/2ML IJ SOLN
100 | INTRAMUSCULAR | Status: AC
Start: 2019-07-18 — End: 2019-07-18

## 2019-07-18 MED ORDER — SODIUM CHLORIDE 0.9 % IV SOLN
0.9 % | INTRAVENOUS | Status: DC | PRN
Start: 2019-07-18 — End: 2019-07-18

## 2019-07-18 MED ORDER — LABETALOL HCL 5 MG/ML IV SOLN
5 MG/ML | INTRAVENOUS | Status: DC | PRN
Start: 2019-07-18 — End: 2019-07-18

## 2019-07-18 MED ORDER — FENTANYL CITRATE (PF) 100 MCG/2ML IJ SOLN
100 MCG/2ML | INTRAMUSCULAR | Status: DC | PRN
Start: 2019-07-18 — End: 2019-07-18

## 2019-07-18 MED ORDER — TOBRAMYCIN 0.3 % OP SOLN
0.3 % | OPHTHALMIC | Status: AC
Start: 2019-07-18 — End: 2019-07-18
  Administered 2019-07-18: 14:00:00 1 [drp] via OPHTHALMIC

## 2019-07-18 MED ORDER — HYDROMORPHONE HCL 1 MG/ML IJ SOLN
1 MG/ML | INTRAMUSCULAR | Status: DC | PRN
Start: 2019-07-18 — End: 2019-07-18

## 2019-07-18 MED ORDER — POVIDONE-IODINE 5 % OP SOLN
5 % | OPHTHALMIC | Status: DC | PRN
Start: 2019-07-18 — End: 2019-07-18
  Administered 2019-07-18: 14:00:00 1 via OPHTHALMIC

## 2019-07-18 MED ORDER — BSS IO SOLN
INTRAOCULAR | Status: DC | PRN
Start: 2019-07-18 — End: 2019-07-18
  Administered 2019-07-18: 14:00:00 500 via OPHTHALMIC

## 2019-07-18 MED ORDER — HYDRALAZINE HCL 20 MG/ML IJ SOLN
20 MG/ML | INTRAMUSCULAR | Status: DC | PRN
Start: 2019-07-18 — End: 2019-07-18

## 2019-07-18 MED ORDER — LACTATED RINGERS IV SOLN
INTRAVENOUS | Status: DC
Start: 2019-07-18 — End: 2019-07-18
  Administered 2019-07-18: 14:00:00 via INTRAVENOUS

## 2019-07-18 MED ORDER — DIPHENHYDRAMINE HCL 50 MG/ML IJ SOLN
50 MG/ML | Freq: Once | INTRAMUSCULAR | Status: DC | PRN
Start: 2019-07-18 — End: 2019-07-18

## 2019-07-18 MED ORDER — NORMAL SALINE FLUSH 0.9 % IV SOLN
0.9 % | INTRAVENOUS | Status: DC | PRN
Start: 2019-07-18 — End: 2019-07-18

## 2019-07-18 MED ORDER — MORPHINE SULFATE (PF) 2 MG/ML IV SOLN
2 | INTRAVENOUS | Status: DC | PRN
Start: 2019-07-18 — End: 2019-07-18

## 2019-07-18 MED ORDER — FENTANYL CITRATE (PF) 100 MCG/2ML IJ SOLN
100 MCG/2ML | INTRAMUSCULAR | Status: DC | PRN
Start: 2019-07-18 — End: 2019-07-18
  Administered 2019-07-18 (×2): 50 via INTRAVENOUS

## 2019-07-18 MED ORDER — PHENYLEPHRINE HCL 2.5 % OP SOLN
2.5 % | OPHTHALMIC | Status: DC
Start: 2019-07-18 — End: 2019-07-18

## 2019-07-18 MED ORDER — NORMAL SALINE FLUSH 0.9 % IV SOLN
0.9 % | Freq: Two times a day (BID) | INTRAVENOUS | Status: DC
Start: 2019-07-18 — End: 2019-07-18

## 2019-07-18 MED ORDER — TETRACAINE HCL 0.5 % OP SOLN
0.5 % | OPHTHALMIC | Status: DC | PRN
Start: 2019-07-18 — End: 2019-07-18
  Administered 2019-07-18: 14:00:00 1

## 2019-07-18 MED ORDER — MIDAZOLAM HCL 2 MG/2ML IJ SOLN
2 MG/ML | INTRAMUSCULAR | Status: DC | PRN
Start: 2019-07-18 — End: 2019-07-18
  Administered 2019-07-18: 14:00:00 2 via INTRAVENOUS

## 2019-07-18 MED ORDER — NEOMYCIN-POLYMYXIN-DEXAMETH 3.5-10000-0.1 OP OINT
OPHTHALMIC | Status: DC | PRN
Start: 2019-07-18 — End: 2019-07-18
  Administered 2019-07-18: 14:00:00 1 via OPHTHALMIC

## 2019-07-18 MED ORDER — PROMETHAZINE HCL 25 MG/ML IJ SOLN
25 MG/ML | Freq: Once | INTRAMUSCULAR | Status: DC | PRN
Start: 2019-07-18 — End: 2019-07-18

## 2019-07-18 MED ORDER — MEPERIDINE HCL 25 MG/ML IJ SOLN
25 MG/ML | INTRAMUSCULAR | Status: DC | PRN
Start: 2019-07-18 — End: 2019-07-18

## 2019-07-18 MED ORDER — TETRACAINE HCL 0.5 % OP SOLN
0.5 % | OPHTHALMIC | Status: DC
Start: 2019-07-18 — End: 2019-07-18
  Administered 2019-07-18: 14:00:00 1 [drp] via OPHTHALMIC

## 2019-07-18 MED ORDER — MIDAZOLAM HCL 2 MG/2ML IJ SOLN
2 | INTRAMUSCULAR | Status: AC
Start: 2019-07-18 — End: 2019-07-18

## 2019-07-18 MED FILL — MIDAZOLAM HCL 2 MG/2ML IJ SOLN: 2 mg/mL | INTRAMUSCULAR | Qty: 2

## 2019-07-18 MED FILL — FENTANYL CITRATE (PF) 100 MCG/2ML IJ SOLN: 100 MCG/2ML | INTRAMUSCULAR | Qty: 2

## 2019-07-18 NOTE — Discharge Instructions (Signed)
[facility]  Instructions after Vitreoretinal Surgery  [phone]  Your post op appointment is 07/19/19 with Dr. Penelope Coop at 9:30 in [place],  [address] .     It is very important to follow these instructions after your eye surgery to ensure its success.  Please bring this sheet with you to your first follow up appointment.    1.   A responsible adult must drive you home after surgery. You might feel well, but the medications given during the surgery might affect you for several hours afterward. Do not drive, operate machinery or participate in any activity that requires coordination or judgment for at least 24 hours after your surgery.   2. DO NOT take the patch and shield off, unless otherwise instructed by your physician. Your doctor will remove it at your follow-up appointment.  The metal/plastic shield will be worn at bedtime for 1 week following your surgery.   3. Your head positioning is comfort      4. Activity should be limited to: NOTHING MORE STRENUOUS THAN WALKING FOR 3 WEEKS. Avoid any straining, lifting or bending for the first week.  5. An ice pack may be used if the eye is uncomfortable. Remove only the metal/plastic shield, leaving the patch in place. (The patch may be discolored, bloody or wet THIS IS NORMAL).  Ice may be used for 20 minutes at a time, every 2 to 4 hours for the first 24-36 hours after surgery.  6. If you have a GAS BUBBLE in your eye: DO NOT REMOVE THE ALERT BRACELET until you are instructed to do so by your physician. In most cases this will remain on for 6-8 weeks. Air travel or traveling in high altitudes is prohibited until approved by your physician.   7. After your surgery you can resume all of your regular medications. This includes Blood thinners such as Aspirin,  Plavix, Coumadin, or Warfarin.   8. Pain-Relieving Medications:  . Extra Strength Tylenol: 2 Tablets every four hours as needed for pain OR  . Advil 200mg : 2 Tablets three  times a day with meals as needed for pain     9. For any nausea or vomiting after your surgery PLEASE CALL THE OFFICE. For any pain that is not relieved by pain medications or that gets worse PLEASE CALL THE OFFICE. If your vision gets worse anytime during the healing process, PLEASE CALL THE OFFICE.   10. Do not consume ANY alcoholic beverages for 48hours after your surgery or while taking medications for pain or nausea.      11. EYE DROPS:  You will have received prescriptions for all the drops listed below. Please fill the prescriptions, DO NOT USE THE DROPS, bring the drops to your first postoperative appointment (time listed above). These medications are available in a generic form and should be less expensive than the brand name medication. Please make sure you ask the pharmacist for the generic forms of these medications, if you want them. Also bring any other eye drops that you are taking to this appointment:    PREDNISOLONE, use 1 drop in the Surgical eye 4 times a day.   OFLOXACIN, use 1 drop in the Surgical eye 4 times a day    12.  A warm moist compress may be used 4-6 times a day for comfort AFTER your follow up appointment.  13. You should wear some type of eye protection (metal shield, your glasses, or sunglasses) if you are  somewhere where dust or debris could potentially get into the eye. Sunglasses may be worn for your comfort while outside or driving.  14. If your eye waters or tears a great deal, please blot it gently with a clean tissue.  DO NOT rub your eye with a tissue or your fingers.  15. Please bring this sheet and all of your Eye Medications to all of your follow up appointments.  16. If you have any questions or difficulties, contact the office where you are normally seen during normal business hours. Please reserve after hours calls for emergencies. If you have a life-threatening emergency (shortness of breath, chest pain etc) please call 911.

## 2019-07-18 NOTE — Progress Notes (Signed)
Right eye patch and shield on and intact

## 2019-07-18 NOTE — H&P (Signed)
HISTORY AND PHYSICAL             Date: 07/18/2019        Patient Name: Andrea Mcintosh     Date of Birth: 1945-09-05      Age:  74 y.o.    Chief Complaint   No chief complaint on file.     Decreased vision in her right eye x 1 month    History Obtained From   patient    History of Present Illness   Patient states her vision has decreased suddenly x 1 month. She had a onset of floaetrs then loss of vision     Past Medical History     Past Medical History:   Diagnosis Date   ??? Bronchitis    ??? CAD (coronary artery disease)     MI 2002: last seen cardilogist 2019- unable to name   ??? Cancer ([place])     breast right 2003; thyroid Cancer 2003   ??? CHF (congestive heart failure) ([place])     Pt is poor historian   ??? Diabetes mellitus ([place])     PO medication   ??? Hyperlipidemia    ??? Hypertension    ??? Pneumothorax     2003- after masectomy surgery   ??? Thyroid disease         Past Surgical History     Past Surgical History:   Procedure Laterality Date   ??? BREAST SURGERY      2003 Right masectomy   ??? JOINT REPLACEMENT      left   ??? THYROIDECTOMY, COMPLETION      2003        Medications Prior to Admission     Prior to Admission medications    Medication Sig Start Date End Date Taking? Authorizing Provider   risperiDONE (RISPERDAL) 1 MG tablet Take 1 mg by mouth nightly   Yes Historical Provider, MD   levothyroxine (SYNTHROID) 88 MCG tablet Take 88 mcg by mouth Daily   Yes Historical Provider, MD   famotidine (PEPCID) 40 MG tablet Take 40 mg by mouth daily   Yes Historical Provider, MD   metFORMIN (GLUCOPHAGE) 500 MG tablet Take 500 mg by mouth daily   Yes Historical Provider, MD   metoprolol tartrate (LOPRESSOR) 50 MG tablet Take 50 mg by mouth 2 times daily   Yes Historical Provider, MD   furosemide (LASIX) 40 MG tablet Take 40 mg by mouth daily   Yes Historical Provider, MD   Calcium Carbonate-Vitamin D (OYSTER SHELL CALCIUM/D PO) Take by mouth 2 times daily   Yes Historical Provider, MD   Vitamin D, Cholecalciferol, 25 MCG (1000 UT)  TABS Take by mouth   Yes Historical Provider, MD   atorvastatin (LIPITOR) 20 MG tablet Take 20 mg by mouth daily   Yes Historical Provider, MD        Allergies   Patient has no known allergies.    Social History     Social History     Tobacco History     Smoking Status  Never Smoker    Smokeless Tobacco Use  Never Used          Alcohol History     Alcohol Use Status  Not Currently          Drug Use     Drug Use Status  Never          Sexual Activity     Sexually Active  Not Asked  Family History   History reviewed. No pertinent family history.    Review of Systems   Review of Systems   Constitutional: Negative.    Cardiovascular: Negative.    Gastrointestinal: Negative.    Endocrine: Positive for cold intolerance.   Genitourinary: Negative.    Skin: Negative.    Neurological: Negative.        Physical Exam   Ht 5\' 1"  (1.549 m)    Wt 182 lb (82.6 kg)    BMI 34.39 kg/m??     Physical Exam  Vitals reviewed.   Constitutional:       Appearance: Normal appearance.   HENT:      Head: Normocephalic and atraumatic.      Mouth/Throat:      Mouth: Mucous membranes are moist.   Cardiovascular:      Rate and Rhythm: Normal rate and regular rhythm.   Pulmonary:      Effort: Pulmonary effort is normal.      Breath sounds: Normal breath sounds.   Abdominal:      General: Bowel sounds are normal.   Musculoskeletal:      Cervical back: Normal range of motion.   Neurological:      Mental Status: She is alert and oriented to person, place, and time.         Labs    No results found for this or any previous visit (from the past 24 hour(s)).     Imaging/Diagnostics Last 24 Hours   No results found.    Assessment        Plan   1. Proceed with Surgery per Anesthesia Guidelines.     H/P performed by [facility] PA-C    Consultations Ordered:  None    Electronically signed by [facility], MD on 07/18/19 at 9:47 AM EDT

## 2019-07-18 NOTE — Op Note (Signed)
Operative Note      Patient: Andrea Mcintosh  Date of Birth: 1945-09-22  MRN: [id number]    Date of Procedure: 07/18/2019    PSurgeon(s):  Vaughan Sine, MD    Preoperative Diagnosis:  1. Vitreous Hemorrhage, Retinal Macroaneurysm Right eye    Postoperative Diagnosis:  1. Same Right  eye    Procedure Performed:    1. Pars plana vitrectomy, Right eye                                             2. Endolaser, Right eye    Complications:   None    Blood loss: <20ml    Anesthesia Used:  Monitored Anesthesia Care    Operative Course:  After history, physical, and consent was obtained, the patient was taken to the operating room and given a bolus of IV sedating medication. The patient then received a retro bulbar block of 1:1 Lidocaine and Marcaine to the right eye.  The operative eye was then prepped and draped in the usual sterile fashion.  Lid speculum was placed between the eyelids.  The transscleral cannulas were inserted in the usual location, two temporally and one superonasally at 3 to 3.5 mm. posterior to the limbus.  An infusion cannula was inserted inferotemporally.  The light pipe and vitrector handpiece were inserted in the eye and a core vitrectomy was performed clearing the vitreous hemorrhage.  A posterior hyaloid separation was confirmed by using aspiration and the posterior hyaloid was confirmed to be peeled into the periphery and excised.Endolaser was applied surrounding the macroaneurysm. The periphery was inspected and there were no retinal tears or detachments.  The transscleral cannulas were removed and the wounds were self sealing.  The eye was dressed with Maxitrol ointment, pressure patch and Fox shield.  The patient was taken from the operating room to recovery awake and in good condition.     The patient will follow up in the office for a postoperative check.

## 2019-07-18 NOTE — Discharge Summary (Signed)
Patient discharged with family when meeting PACU requirements.
